# Patient Record
Sex: Female | Born: 1975 | Race: Black or African American | Hispanic: No | Marital: Single | Smoking: Never smoker
Health system: Southern US, Community
[De-identification: ages and names within clinical notes are randomized; demographics above are authoritative.]

## PROBLEM LIST (undated history)

## (undated) DIAGNOSIS — E119 Type 2 diabetes mellitus without complications: Secondary | ICD-10-CM

## (undated) DIAGNOSIS — F329 Major depressive disorder, single episode, unspecified: Secondary | ICD-10-CM

## (undated) DIAGNOSIS — F819 Developmental disorder of scholastic skills, unspecified: Secondary | ICD-10-CM

## (undated) DIAGNOSIS — F32A Depression, unspecified: Secondary | ICD-10-CM

## (undated) DIAGNOSIS — K219 Gastro-esophageal reflux disease without esophagitis: Secondary | ICD-10-CM

## (undated) DIAGNOSIS — G4733 Obstructive sleep apnea (adult) (pediatric): Secondary | ICD-10-CM

## (undated) DIAGNOSIS — E785 Hyperlipidemia, unspecified: Secondary | ICD-10-CM

## (undated) DIAGNOSIS — E669 Obesity, unspecified: Secondary | ICD-10-CM

## (undated) DIAGNOSIS — F419 Anxiety disorder, unspecified: Secondary | ICD-10-CM

## (undated) HISTORY — DX: Depression, unspecified: F32.A

## (undated) HISTORY — PX: ABDOMINAL HYSTERECTOMY: SHX81

## (undated) HISTORY — DX: Hyperlipidemia, unspecified: E78.5

## (undated) HISTORY — DX: Obesity, unspecified: E66.9

## (undated) HISTORY — DX: Type 2 diabetes mellitus without complications: E11.9

## (undated) HISTORY — DX: Gastro-esophageal reflux disease without esophagitis: K21.9

## (undated) HISTORY — DX: Major depressive disorder, single episode, unspecified: F32.9

## (undated) HISTORY — DX: Obstructive sleep apnea (adult) (pediatric): G47.33

## (undated) HISTORY — PX: CHOLECYSTECTOMY: SHX55

## (undated) HISTORY — DX: Anxiety disorder, unspecified: F41.9

---

## 1997-09-19 ENCOUNTER — Ambulatory Visit (HOSPITAL_COMMUNITY): Admission: RE | Admit: 1997-09-19 | Discharge: 1997-09-19 | Payer: Self-pay | Admitting: *Deleted

## 1999-05-03 ENCOUNTER — Emergency Department (HOSPITAL_COMMUNITY): Admission: EM | Admit: 1999-05-03 | Discharge: 1999-05-03 | Payer: Self-pay | Admitting: Emergency Medicine

## 2001-03-17 ENCOUNTER — Inpatient Hospital Stay (HOSPITAL_COMMUNITY): Admission: EM | Admit: 2001-03-17 | Discharge: 2001-03-29 | Payer: Self-pay | Admitting: Psychiatry

## 2001-05-12 ENCOUNTER — Encounter: Admission: RE | Admit: 2001-05-12 | Discharge: 2001-05-12 | Payer: Self-pay | Admitting: *Deleted

## 2001-10-20 ENCOUNTER — Encounter: Admission: RE | Admit: 2001-10-20 | Discharge: 2001-10-20 | Payer: Self-pay | Admitting: *Deleted

## 2002-02-06 ENCOUNTER — Encounter: Admission: RE | Admit: 2002-02-06 | Discharge: 2002-02-06 | Payer: Self-pay | Admitting: Internal Medicine

## 2002-02-06 ENCOUNTER — Encounter: Payer: Self-pay | Admitting: Internal Medicine

## 2002-09-21 ENCOUNTER — Encounter: Admission: RE | Admit: 2002-09-21 | Discharge: 2002-09-21 | Payer: Self-pay | Admitting: Internal Medicine

## 2002-09-21 ENCOUNTER — Encounter: Payer: Self-pay | Admitting: Internal Medicine

## 2002-09-22 ENCOUNTER — Emergency Department (HOSPITAL_COMMUNITY): Admission: EM | Admit: 2002-09-22 | Discharge: 2002-09-22 | Payer: Self-pay

## 2002-09-22 ENCOUNTER — Encounter: Payer: Self-pay | Admitting: Emergency Medicine

## 2002-10-31 ENCOUNTER — Inpatient Hospital Stay (HOSPITAL_COMMUNITY): Admission: RE | Admit: 2002-10-31 | Discharge: 2002-11-02 | Payer: Self-pay | Admitting: Obstetrics and Gynecology

## 2002-10-31 ENCOUNTER — Encounter (INDEPENDENT_AMBULATORY_CARE_PROVIDER_SITE_OTHER): Payer: Self-pay | Admitting: Specialist

## 2003-08-02 ENCOUNTER — Encounter: Admission: RE | Admit: 2003-08-02 | Discharge: 2003-08-02 | Payer: Self-pay | Admitting: Internal Medicine

## 2003-08-09 ENCOUNTER — Encounter: Admission: RE | Admit: 2003-08-09 | Discharge: 2003-08-09 | Payer: Self-pay | Admitting: Internal Medicine

## 2003-08-30 ENCOUNTER — Encounter (INDEPENDENT_AMBULATORY_CARE_PROVIDER_SITE_OTHER): Payer: Self-pay | Admitting: *Deleted

## 2003-08-30 ENCOUNTER — Observation Stay (HOSPITAL_COMMUNITY): Admission: RE | Admit: 2003-08-30 | Discharge: 2003-08-31 | Payer: Self-pay | Admitting: General Surgery

## 2003-11-14 ENCOUNTER — Ambulatory Visit (HOSPITAL_COMMUNITY): Admission: RE | Admit: 2003-11-14 | Discharge: 2003-11-14 | Payer: Self-pay | Admitting: Gastroenterology

## 2005-07-22 ENCOUNTER — Ambulatory Visit (HOSPITAL_COMMUNITY): Payer: Self-pay | Admitting: *Deleted

## 2005-10-07 ENCOUNTER — Ambulatory Visit (HOSPITAL_COMMUNITY): Payer: Self-pay | Admitting: *Deleted

## 2006-01-14 ENCOUNTER — Ambulatory Visit (HOSPITAL_COMMUNITY): Payer: Self-pay | Admitting: *Deleted

## 2006-01-21 ENCOUNTER — Ambulatory Visit (HOSPITAL_COMMUNITY): Admission: RE | Admit: 2006-01-21 | Discharge: 2006-01-21 | Payer: Self-pay | Admitting: Gastroenterology

## 2006-04-12 ENCOUNTER — Ambulatory Visit (HOSPITAL_COMMUNITY): Payer: Self-pay | Admitting: *Deleted

## 2006-07-12 ENCOUNTER — Ambulatory Visit (HOSPITAL_COMMUNITY): Payer: Self-pay | Admitting: *Deleted

## 2006-10-11 ENCOUNTER — Ambulatory Visit (HOSPITAL_COMMUNITY): Payer: Self-pay | Admitting: *Deleted

## 2007-01-10 ENCOUNTER — Ambulatory Visit (HOSPITAL_COMMUNITY): Payer: Self-pay | Admitting: *Deleted

## 2007-04-13 ENCOUNTER — Ambulatory Visit (HOSPITAL_COMMUNITY): Payer: Self-pay | Admitting: *Deleted

## 2007-07-20 ENCOUNTER — Ambulatory Visit (HOSPITAL_COMMUNITY): Payer: Self-pay | Admitting: *Deleted

## 2007-10-24 ENCOUNTER — Ambulatory Visit (HOSPITAL_COMMUNITY): Payer: Self-pay | Admitting: *Deleted

## 2008-01-16 ENCOUNTER — Ambulatory Visit (HOSPITAL_COMMUNITY): Payer: Self-pay | Admitting: *Deleted

## 2008-04-30 ENCOUNTER — Ambulatory Visit (HOSPITAL_COMMUNITY): Payer: Self-pay | Admitting: *Deleted

## 2008-08-06 ENCOUNTER — Ambulatory Visit (HOSPITAL_COMMUNITY): Payer: Self-pay | Admitting: *Deleted

## 2008-11-07 ENCOUNTER — Ambulatory Visit (HOSPITAL_COMMUNITY): Payer: Self-pay | Admitting: *Deleted

## 2009-01-29 ENCOUNTER — Ambulatory Visit (HOSPITAL_COMMUNITY): Payer: Self-pay | Admitting: Psychiatry

## 2009-04-11 ENCOUNTER — Ambulatory Visit (HOSPITAL_COMMUNITY): Payer: Self-pay | Admitting: Psychiatry

## 2009-05-19 ENCOUNTER — Ambulatory Visit (HOSPITAL_COMMUNITY): Payer: Self-pay | Admitting: Psychiatry

## 2009-07-14 ENCOUNTER — Ambulatory Visit (HOSPITAL_COMMUNITY): Payer: Self-pay | Admitting: Psychiatry

## 2009-10-01 ENCOUNTER — Ambulatory Visit (HOSPITAL_COMMUNITY): Payer: Self-pay | Admitting: Psychiatry

## 2009-11-26 ENCOUNTER — Ambulatory Visit (HOSPITAL_COMMUNITY): Payer: Self-pay | Admitting: Psychiatry

## 2010-01-19 ENCOUNTER — Ambulatory Visit (HOSPITAL_COMMUNITY): Payer: Self-pay | Admitting: Psychiatry

## 2010-03-18 ENCOUNTER — Ambulatory Visit (HOSPITAL_COMMUNITY)
Admission: RE | Admit: 2010-03-18 | Discharge: 2010-03-18 | Payer: Self-pay | Source: Home / Self Care | Attending: Psychiatry | Admitting: Psychiatry

## 2010-05-11 ENCOUNTER — Encounter (HOSPITAL_COMMUNITY): Payer: Medicaid Other | Admitting: Psychiatry

## 2010-05-11 DIAGNOSIS — F259 Schizoaffective disorder, unspecified: Secondary | ICD-10-CM

## 2010-07-17 NOTE — H&P (Signed)
NAME:  Erika [name], Erika [name]                       ACCOUNT NO.:  [id number]   MEDICAL RECORD NO.:  [id number]                   PATIENT TYPE:  INP   LOCATION:  NA                                   FACILITY:  [facility]   PHYSICIAN:  Katherine Roan, M.D.               DATE OF BIRTH:  Jun 01, 1975   DATE OF ADMISSION:  DATE OF DISCHARGE:                                HISTORY & PHYSICAL   CHIEF COMPLAINT:  Large abdominopelvic mass.   HISTORY OF PRESENT ILLNESS:  The patient is a 35 year old 69 pound female  who is on multiple medications, had not been seen for over a year, was seen  in the emergency room with abdominal pain and found to have a large 17 cm  fibroid.  Because of this large pelvic mass, the patient is scheduled for  laparotomy, possible hysterectomy.  Because of her age and mentally  challenged status, they both have agreed to the procedure.   MEDICATIONS:  1. Xanax.  2. Zyprexa.  3. Zoloft.  4. Ambien.   ALLERGIES:  HALDOL.   REVIEW OF SYSTEMS:  HEENT:  She denies any decrease in visual or auditory  acuity.  No headache or no hypertension, no chest pain, no history of mitral  valve prolapse.  LUNGS:  No chronic cough, no asthma.  GENITOURINARY:  No  stress incontinence or frequent UTI's.  GASTROINTESTINAL:  Negative.  MUSCLES, BONES, AND JOINTS:  Negative.  No fractures or arthritis.   SOCIAL HISTORY:  She works at [employer], does not smoke.   FAMILY HISTORY:  Her mother and father are both living and well.  She has  one brother.   PHYSICAL EXAMINATION:  GENERAL:  A well-developed, 35 year old obese black  female.  She is a gravida 1, para 0.  HEENT:  Unremarkable.  The oropharynx is not injected.  NECK:  Supple.  Carotid pulses are equal without bruits.  Thyroid is not  enlarged.  BREASTS:  No masses or tenderness.  She has multiple small pigmented lesions  over the anterior thorax and breasts.  LUNGS:  Clear.  HEART:  Normal sinus rhythm, no murmurs.  ABDOMEN:   Markedly obese.  The uterus is well above the umbilicus.  No other  masses are noted.  She is quite sensitive.  Bowel sounds are normal, and no  bruits are heard.  EXTREMITIES:  Trace edema, normal reflexes.  PELVIC:  A fairly small cervix and uterus is enlarged with multiple myomas.  EXTREMITIES:  Good range of motion, equal pulses and reflexes.   IMPRESSION:  Large abdominopelvic mass felt to be a large uterine fibroid on  multiple studies.   PLAN:  Laparotomy and hysterectomy as needed.  Risks and benefits have been  discussed with the patient and with her mother.  Katherine Roan, M.D.    SDM/MEDQ  D:  10/29/2002  T:  10/29/2002  Job:  [id number]

## 2010-07-17 NOTE — Discharge Summary (Signed)
   Erika [name], WILMOUTH                       ACCOUNT NO.:  [id number]   MEDICAL RECORD NO.:  [id number]                   PATIENT TYPE:  INP   LOCATION:  0448                                 FACILITY:  [facility]   PHYSICIAN:  Katherine Roan, M.D.               DATE OF BIRTH:  04/15/1975   DATE OF ADMISSION:  10/31/2002  DATE OF DISCHARGE:  11/02/2002                                 DISCHARGE SUMMARY   ADMISSION DIAGNOSES:  Large pelvic mass.   DISCHARGE DIAGNOSES:  Large uterine fibroid.   BRIEF HISTORY:  This is a 35 year old female on multiple medications  including Zyprexa and Zoloft.  Was seen in the ER with abdominal pain and  found to have a large 17 cm fibroid.  The patient has mentally challenged  issues and discussion with mother and patient about hysterectomy was  accomplished pre admission.   LABORATORIES:  Admission hemoglobin 12.  Routine chemistries were normal.  Chest x-ray was essentially normal.  Her other comorbidities were morbid  obesity.   HOSPITAL COURSE:  On September 1 she underwent a laparotomy with total  hysterectomy with the finding of a large uterine fibroid.  The uterus  pathologically was benign but the uterus weighed 1529 g.  There were some  mild atypical changes in one of the fibroids.  Her postoperative course was  uncomplicated.  She remained afebrile and without complaints and was  discharged to home and office care.  She was asked to return to the office  in approximately three days for incision check.   CONDITION ON DISCHARGE:  Improved.                                               Katherine Roan, M.D.    SDM/MEDQ  D:  11/22/2002  T:  11/22/2002  Job:  [id number]

## 2010-07-17 NOTE — Discharge Summary (Signed)
[facility]  Patient:    Erika [name], Erika [name] Visit Number: [id number] MRN: [id number]          Service Type: PSY Location: 400 0402 02 Attending Physician:  Jeanice Lim Dictated by:   Jeanice Lim, M.D. Admit Date:  03/17/2001 Discharge Date: 03/29/2001                             Discharge Summary  IDENTIFYING DATA:  This was a 35 year old African-American female, voluntarily admitted, referred by Jasmine Pang, M.D., secondary to agitation, auditory hallucinations, weird dreams, repetitive behaviors, and believing that she is pregnant.  MEDICATIONS: 1. Zoloft 25 mg b.i.d. 2. Hydrochlorothiazide 25 mg three times a week.  DRUG ALLERGIES:  PROZAC, HALDOL, and THORAZINE.  THORAZINE caused NMS.  PHYSICAL EXAMINATION:  Unremarkable.  Neurologically nonfocal.  Mental Status: An obese African-American female.  Agitated, turning away, and fearful. Speech pressured.  Looking at the ceiling and making poor eye contact. Talking about the devil.  Mood:  Agitated and fearful.  Thought Processes: Disorganized.  Thought Content:  Positive for auditory hallucinations and delusions.  Cognitively intact.  LABORATORY DATA:  Routine admission labs essentially within normal limits.  ADMISSION DIAGNOSES: Axis I.    Psychosis not otherwise specified. Axis II.   Mild mitral regurgitation. Axis III.  1. Rule out pregnancy.            2. Urinary tract infection.            3. History of neuroleptic malignant syndrome.            4. Hypokalemia. Axis IV.   Psychosocial stressors:  Moderate, problems with primary support. Axis V.    Global assessment of functioning:  28/56.  HOSPITAL COURSE:  The patient was admitted.  Ordered routine p.r.n. medications.  Monitored medically.  Restarted on hydrochlorothiazide and K-Dur to replace potassium.  Requiring one to one due to disorganized behavior. Seroquel was titrated along with Ativan and conventional  antipsychotics were avoided to avoid risk of NMS.  Due to low-grade temperature, Seroquel was held and CPK was checked, which was within normal limits.  Zyprexa was then restarted for psychotic symptoms.  Blood cultures were obtained to rule out sepsis.  Ativan and Zyprexa were titrated.  As well, Zoloft was started for a depressive component.  The patient tolerated medication changes well and clinical intervention, showing improvement in symptoms.  Her condition at discharge was improved.  Mood was more stable and euthymic.  Affect brighter. Thought processes goal directed.  Thought content negative for overt psychotic symptoms.  The patient denied dangerous ideation and reported motivation to be compliant with a follow-up plan showing improvement in judgment and insight.  DISCHARGE MEDICATIONS: 1. Ativan 0.5 mg one-half q.a.m., one-half at 3 p.m., and one q.h.s. 2. Ambien 10 mg q.h.s. p.r.n. insomnia. 3. Zyprexa 2.5 mg q.a.m., at 3 p.m., and 15 mg q.h.s. 4. Zoloft 100 mg one-half q.a.m.  FOLLOW-UP:  The patient was to follow up with Jasmine Pang, M.D., on March 31, 2001, at 2 p.m.  DISCHARGE DIAGNOSES: Axis I.    Psychosis not otherwise specified. Axis II.   Mild mitral regurgitation. Axis III.  1. Rule out pregnancy.            2. Urinary tract infection.            3. History of ______  4. Hypokalemia. Axis IV.   Psychosocial stressors:  Moderate, problems with primary support. Axis V.    Global assessment of functioning:  50. Dictated by:   Jeanice Lim, M.D. Attending Physician:  Jeanice Lim DD:  05/11/01 TD:  05/13/01 Job: [id number]

## 2010-07-17 NOTE — Op Note (Signed)
Erika [name], Erika [name]                       ACCOUNT NO.:  [id number]   MEDICAL RECORD NO.:  [id number]                   PATIENT TYPE:  OBV   LOCATION:  0367                                 FACILITY:  [facility]   PHYSICIAN:  Ollen Gross. Vernell Morgans, M.D.              DATE OF BIRTH:  08-30-1975   DATE OF PROCEDURE:  08/30/2003  DATE OF DISCHARGE:  08/31/2003                                 OPERATIVE REPORT   PREOPERATIVE DIAGNOSIS:  Gallstones.   POSTOPERATIVE DIAGNOSIS:  Gallstones.   OPERATION/PROCEDURE:  Laparoscopic cholecystectomy with intraoperative  cholangiogram.   SURGEON:  Ollen Gross. Carolynne Edouard, M.D.   ASSISTANT:  Leonie Man, M.D.   ANESTHESIA:  General endotracheal anesthesia.   DESCRIPTION OF PROCEDURE:  After informed consent was obtained, the patient  was brought to the operating room and placed in the supine position on the  operating room table.  After adequate induction of general anesthesia, the  patient's abdomen was prepped with Betadine and draped in the usual sterile  manner.  The area below the umbilicus was infiltrated with 0.25% Marcaine.  A small incision was made with the 15-blade knife.  This incision was  carried down through the subcutaneous tissue bluntly with the Kelly clamp  and Army-Navy retractors until the linea alba was identified.  The linea  alba was incised with the 15-blade knife and each side was grasped with  Kocher clamps and elevated anteriorly.  The preperitoneal space was then  probed bluntly with a hemostat until the peritoneum was opened and access  was gained to the abdominal cavity.  A 0 Vicryl pursestring stitch was  placed in the fascia surrounding the opening. The Hasson cannula was placed  through the opening and anchored in place with the previously placed Vicryl  pursestring stitch.  The abdomen was then insufflated with carbon dioxide  without difficulty.  The patient was placed in the head-up position and  rotated slightly with the  right side up.  The laparoscopic was placed  through the Hasson cannula and the right upper quadrant was inspected.  The  dome of the gallbladder and liver were readily identified.  The epigastric  region was then infiltrated with 0.25% Marcaine.  A small incision was made  with the 15-blade knife and a 10 mm port was placed bluntly through this  incision into the abdominal cavity under direct vision.  Sites were then  chosen laterally on the right side of the abdomen with placement of a 5 mm  ports.  Each of these areas was infiltrated with 0.25% Marcaine.  Small stab  incisions were made with 15-blade knife and 5 mm ports were placed bluntly  through these incisions into the abdominal cavity under direct vision.  A  blunt grasper was placed through the lateral-most 5 mm port and used to  grasp the dome of the gallbladder and elevate it anteriorly and superiorly.  Another blunt grasper was  placed in the other 5 mm port and used to retract  on the body and neck of the gallbladder.  The dissector was placed through  the epigastric port and using the electrocautery, the peritoneal reflection  at the gallbladder neck area was opened.  Blunt dissection was then carried  out in this area until the gallbladder neck-cystic duct junction was readily  identified and a good window was created.  A single clip was placed  proximally on the gallbladder neck.  A small ductotomy was made just below  this with the with the laparoscopic scissors.  A 14-gauge angiocath was  placed percutaneously through the anterior abdominal wall under direct  vision.  A Reddick cholangiogram catheter was placed through the angiocath  and flushed.  The catheter was then placed within the cystic duct and  anchored in place with a clip.  A cholangiogram was obtained that showed no  filling defects, a good length on the cystic duct and rapid emptying into  the duodenum.  The anchoring clip and catheters were then removed from  the  patient.  Three  clips were placed proximally on the cystic duct and the  duct was divided between the two sets of clips.  Posterior to this the  cystic artery was identified and again dissected bluntly in a  circumferential manner until a good window was created.  Two clips were  placed proximally and one distally on the artery and the artery was divided  between the two.  Next, the laparoscopic hook cautery device was used to  separate the gallbladder from the liver bed.  Prior to completely detaching  the gallbladder from the liver bed, the liver bed was inspected and several  small bleeding points were coagulated with the electrocautery.  The  gallbladder was then detached the rest of the way from the liver bed and  using the hook electrocautery without difficulty, laparoscopic bag was  placed through the epigastric port and the gallbladder we placed within the  bag and the bag was sealed.  The laparoscopic was then moved to the  epigastric port.  The abdomen was then irrigated with copious amounts of  saline until the effluent was clear.  The liver bed was inspected again and  found to be hemostatic. A gallbladder grasper was placed through the Hasson  cannula and used to grasp the opening of the bag.  The bag with the  gallbladder was then removed through the infraumbilical port without  difficulty.  The fascial defect was closed with the 0 Vicryl pursestring  stitch as well as with another interrupted 0 Vicryl stitch.  The rest of the  ports were removed under direct vision and found to be hemostatic.  The gas  was allowed to escape.  The skin incisions were all closed with interrupted  4-0 Monocryl subcuticular stitches.  Benzoin and Steri-Strips were applied.  The patient tolerated the procedure well.  At the end of the case all  needle, sponge and instrument counts were correct.  The patient was awakened and taken to the recovery room in stable condition.                                                Ollen Gross. Vernell Morgans, M.D.    PST/MEDQ  D:  09/09/2003  T:  09/09/2003  Job:  [id number]

## 2010-07-17 NOTE — Op Note (Signed)
NAME:  Erika [name], Erika [name]                       ACCOUNT NO.:  [id number]   MEDICAL RECORD NO.:  [id number]                   PATIENT TYPE:  AMB   LOCATION:  ENDO                                 FACILITY:  [facility]   PHYSICIAN:  James L. Malon Kindle., M.D.          DATE OF BIRTH:  1975/06/30   DATE OF PROCEDURE:  11/14/2003  DATE OF DISCHARGE:                                 OPERATIVE REPORT   PROCEDURE:  Endoscopy.   MEDICATIONS:  1.  Fentanyl 100 mcg.  2.  Versed 10 mg IV.  3.  Cetacaine spray.   INDICATION:  Vague abdominal pain, heme-positive stool with a history of  melena.   DESCRIPTION OF PROCEDURE:  The procedure had been explained to the patient  and consent obtained.  With the patient in the left lateral decubitus  position, the Olympus scope was inserted and advanced.  The stomach was  entered, pylorus identified and passed.  The duodenum including the bulb and  second portion were seen well and were unremarkable.  The scope was  withdrawn.  The pyloric channel, duodenal bulb were normal.  The antrum and  body were seen well with no ulceration or inflammation.  The fundus and  cardia were seen well on retroflexed view and were normal.  The GE junction  was widely patent.  The distal esophagus was endoscopically normal with no  ulcerations.  The proximal esophagus was normal.  The scope was withdrawn.  The patient tolerated the procedure well.   ASSESSMENT:  1.  Probable gastroesophageal reflux disease.  530.81.  2.  Heme-positive of questionable cause.  792.1.  No obvious cause on upper      endoscopy.   PLAN:  We will see the patient back in the office in 2 weeks.  We will check  the labs already drawn in our office.                                               James L. Malon Kindle., M.D.    Erika [name]  D:  11/14/2003  T:  11/14/2003  Job:  [id number]   cc:   Ralene Ok, M.D.  [address]  [place]  [place] [address]  Fax: [phone]

## 2010-07-17 NOTE — Op Note (Signed)
   NAME:  Erika [name], Erika [name]                       ACCOUNT NO.:  [id number]   MEDICAL RECORD NO.:  [id number]                   PATIENT TYPE:  INP   LOCATION:  0002                                 [facility]ACILITY:  [facility]   PHYSICIAN:  Katherine Roan, M.D.               DATE OF BIRTH:  03/12/1975   DATE OF PROCEDURE:  10/31/2002  DATE OF DISCHARGE:                                 OPERATIVE REPORT   PREOPERATIVE DIAGNOSES:  1. Large uterine fibroid.  2. Pelvic pain.   POSTOPERATIVE DIAGNOSES:  1. Large uterine fibroid.  2. Pelvic pain.   OPERATION:  1. Total abdominal hysterectomy.  2. Pelvic exam under anesthesia.   SURGEON:  Katherine Roan, M.D.   ASSISTANT:  Juluis Mire, M.D.   DESCRIPTION OF PROCEDURE:  Satisfactory general anesthesia in the supine  position.  The patient was anesthetized and exam under anesthesia confirmed  a large pelvic mass which extended above the umbilicus to right at the  xiphoid.  She was prepped and draped over the entire abdomen.  Foley  catheter was inserted.  A midline incision was made in the abdomen and  carried around the umbilicus.  Hemostasis with the Bovie, and the abdomen  was entered.  There was slight free fluid within the abdomen.  The uterus  was markedly elevated, was about 24 cm in length, was delivered into the  operative wound and the uteroovarian anastomoses, tubal structures, and  round ligaments were suture ligated.  Uterine vessels were secured, and a  fundectomy was performed.  Then the cervix was removed using the parametrial  clamps.  The vagina was clamped across and the cervix removed.  The vagina  was closed with one suture of 0 chromic suture.  Hemostasis was secure.  Both ovaries, pedicles, and all appeared to be hemostatically secure.  The  peritoneum was then closed with 2-0 PDS.  The fascia was closed with a  continuous #1 PDS.  I closed that in two layers and reinforced around the  umbilicus with pop-off 0 Novofil x  3 sutures.  The subcutaneum was closed  with 3-0 plain, and the skin was closed with 3-0 nylon and clips.  The  incision was infiltrated with 0.5% Marcaine with epinephrine.  [name]  tolerated this procedure well and was sent to recovery room in good  condition.                                               Katherine Roan, M.D.    SDM/MEDQ  D:  10/31/2002  T:  10/31/2002  Job:  [id number]

## 2010-08-10 ENCOUNTER — Encounter (HOSPITAL_COMMUNITY): Payer: Medicaid Other | Admitting: Psychiatry

## 2010-08-10 DIAGNOSIS — F259 Schizoaffective disorder, unspecified: Secondary | ICD-10-CM

## 2010-10-12 ENCOUNTER — Encounter (HOSPITAL_COMMUNITY): Payer: Medicaid Other | Admitting: Psychiatry

## 2010-10-14 ENCOUNTER — Encounter (HOSPITAL_COMMUNITY): Payer: Medicaid Other | Admitting: Psychiatry

## 2010-10-14 DIAGNOSIS — F259 Schizoaffective disorder, unspecified: Secondary | ICD-10-CM

## 2010-12-09 ENCOUNTER — Encounter (HOSPITAL_COMMUNITY): Payer: Medicaid Other | Admitting: Psychiatry

## 2010-12-14 ENCOUNTER — Encounter (HOSPITAL_COMMUNITY): Payer: Medicaid Other | Admitting: Psychiatry

## 2010-12-23 ENCOUNTER — Encounter (HOSPITAL_COMMUNITY): Payer: Medicaid Other | Admitting: Psychiatry

## 2010-12-23 DIAGNOSIS — F259 Schizoaffective disorder, unspecified: Secondary | ICD-10-CM

## 2011-02-03 ENCOUNTER — Encounter (HOSPITAL_COMMUNITY): Payer: Self-pay | Admitting: Psychiatry

## 2011-02-03 ENCOUNTER — Ambulatory Visit (INDEPENDENT_AMBULATORY_CARE_PROVIDER_SITE_OTHER): Payer: Medicaid Other | Admitting: Psychiatry

## 2011-02-03 VITALS — Wt 277.0 lb

## 2011-02-03 DIAGNOSIS — F209 Schizophrenia, unspecified: Secondary | ICD-10-CM

## 2011-02-03 NOTE — Progress Notes (Signed)
Patient came for her followup appointment with her mother. She has been stable on her current medication. She reported no side effects of medication. Last week she has an incident when patient lost her dog who she has been for past 4 years. As per mother patient become very sad, upset and "shutdown" she was given Ativan 0.5mg   tablet which helped her. Overall patient is doing good. She is sleeping better. She is going to have work regularly. Her complaint was profile on TV but  She did not able to see that program. She denies any anger crying spells or anhedonia. As per mother she is more involved in her daily life and routine. She reported tremors shakes or extrapyramidal side effects. She is taking Ativan only rarely.  Mental status examination Patient is mildly obese female who is casually dressed. She maintained fair eye contact. Her speech is slow and nonspontaneous. She denies any active or passive suicidal thinking and homicidal thinking. She described her mood is okay and her affect is flat. She denies any auditory or visual hallucination. She's alert and oriented x3. Her thought process is slow but logical linear and goal-directed. There are no tremors or shakes present. Her insight judgment and impulse control is okay.  Assessment Schizophrenia chronic paranoid type.  Plan I will continue her current medication which is Abilify 30 mg daily, Zoloft 150 mg daily temazepam 15 mg at bedtime and lorazepam 0.5 mg for severe anxiety. I have explained risks and benefits of medication in detail. Patient is scheduled to see her primary care physician on January 17 where she will get her blood work. I recommended to bring all the blood work on her next visit. I will see her again in 2 months. I recommended to call if she has any question or concern about the medication or if she feels worsening of her symptoms

## 2011-02-03 NOTE — Patient Instructions (Signed)
Please bring blood results on her next visit

## 2011-03-31 ENCOUNTER — Encounter (HOSPITAL_COMMUNITY): Payer: Self-pay | Admitting: Psychiatry

## 2011-03-31 ENCOUNTER — Ambulatory Visit (INDEPENDENT_AMBULATORY_CARE_PROVIDER_SITE_OTHER): Payer: Medicaid Other | Admitting: Psychiatry

## 2011-03-31 VITALS — BP 92/53 | HR 78 | Wt 274.0 lb

## 2011-03-31 DIAGNOSIS — F200 Paranoid schizophrenia: Secondary | ICD-10-CM

## 2011-03-31 MED ORDER — SERTRALINE HCL 100 MG PO TABS: 150.0000 mg | ORAL_TABLET | Freq: Every day | ORAL | Status: DC

## 2011-03-31 MED ORDER — ARIPIPRAZOLE 30 MG PO TABS: 30.0000 mg | ORAL_TABLET | Freq: Every day | ORAL | Status: DC

## 2011-03-31 MED ORDER — TEMAZEPAM 15 MG PO CAPS: 15.0000 mg | ORAL_CAPSULE | Freq: Every day | ORAL | Status: DC

## 2011-03-31 NOTE — Progress Notes (Signed)
Chief Complaint I'm doing better and need my medication  History of presenting illness Patient is 36 year old [place] American female who came for her followup appointment. She was last seen in December. At that time she was having some depression as her dog died she had for past 4 years. Patient overall doing okay on her medication. She does not require any Ativan in past 6 weeks. She is sleeping better. She is now working full-time as a Agricultural consultant. She likes her job. She denies any side effects or concern about the medication. As per mother patient has significantly improved from the past. She is more social verbal and able to do a lot of things in her life. We have cut down slowly and gradually some of the medication which she was on 2 years ago. Patient denies any paranoid thinking agitation anger or mood swings.  Past psychiatric history Patient has significant history of psychiatric illness started in her teens. Patient has at least 3 psychiatric hospitalization at [facility]. Most of psychiatric admissiondictated by paranoid thinking. She denies any history of suicidal attempt.  Family history Patient has a family history of psychiatric ounce.  Psychosocial history Patient was born and raised in [place]. She also spent some life in [place] and currently living with her mother increased to the past many years. Patient has good social network and extended family in this area.  Alcohol and substance use history Patient denies any history of alcohol or substance use.  Medical history Patient has history of GERD and high cholesterol. She has recently seen her primary care physician and blood work was drawn and results were within normal limits.  Current medication Reviewed  Mental status examination Patient is morbid obese female who is casually dressed and fairly groomed. She maintained poor eye contact. Her speech is nonspontaneous however she is relevant in  conversation. She has poverty of thought content but denies any paranoid thinking or delusional thoughts. She denies any active or passive suicidal thoughts or homicidal thoughts. He denies any auditory or visual hallucination. Her thought process is slow logical linear and goal-directed. She's alert and oriented x3. Her response to questions is slow. Her insight judgment and impulse control is okay.  Assessment Axis I schizophrenia chronic paranoid type, rule out MDD Axis II deferred Axis III see medical history Axis IV mild to moderate Axis V 55-60  Plan At this time patient is in stable on her current medication. I have reviewed the blood test which was drawn on the 17th of this month which shows normal cholesterol and triglycerides. Her glucose is 115 and her liver enzymes are within normal limits. I will continue her current medication. She does not need Ativan since she is not taking any more. I have explained risks and benefits of medication in detail. I will see her again in 3 months. Time spent 30 minutes

## 2011-05-24 ENCOUNTER — Ambulatory Visit (INDEPENDENT_AMBULATORY_CARE_PROVIDER_SITE_OTHER): Payer: Medicaid Other | Admitting: Psychiatry

## 2011-05-24 ENCOUNTER — Ambulatory Visit (HOSPITAL_COMMUNITY): Payer: Medicaid Other | Admitting: Psychiatry

## 2011-05-24 ENCOUNTER — Encounter (HOSPITAL_COMMUNITY): Payer: Self-pay | Admitting: Psychiatry

## 2011-05-24 VITALS — Wt 276.0 lb

## 2011-05-24 DIAGNOSIS — F29 Unspecified psychosis not due to a substance or known physiological condition: Secondary | ICD-10-CM

## 2011-05-24 DIAGNOSIS — F200 Paranoid schizophrenia: Secondary | ICD-10-CM

## 2011-05-24 MED ORDER — TEMAZEPAM 15 MG PO CAPS: 15.0000 mg | ORAL_CAPSULE | Freq: Every day | ORAL | Status: DC

## 2011-05-24 MED ORDER — ARIPIPRAZOLE 30 MG PO TABS: 30.0000 mg | ORAL_TABLET | Freq: Every day | ORAL | Status: DC

## 2011-05-24 MED ORDER — SERTRALINE HCL 100 MG PO TABS: 150.0000 mg | ORAL_TABLET | Freq: Every day | ORAL | Status: DC

## 2011-05-24 NOTE — Progress Notes (Signed)
Chief Complaint  medication management and followup   History of presenting illness Patient is 36 year old [place] American female who came for her followup appointment.  She's been compliant with her medication and reported no side effects.  Her mother endorse much improvement in her mood swing anger psychosis and paranoia.  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  She is more active alert and involved in her daily life.  She reported no side effects of medication.  She likes her volunteer work.  She has no concern that medication.  Patient denies any agitation anger or any paranoid thinking.  She is scheduled to see her primary care physician in June.  Past psychiatric history Patient has significant history of psychiatric illness started in her teens. Patient has at least 3 psychiatric hospitalization at [facility]. Most of psychiatric admissiondictated by paranoid thinking. She denies any history of suicidal attempt.  Family history Patient has a family history of psychiatric ounce.  Psychosocial history Patient was born and raised in [place]. She also spent some life in [place] and currently living with her mother increased to the past many years. Patient has good social network and extended family in this area.  Alcohol and substance use history Patient denies any history of alcohol or substance use.  Medical history Patient has history of GERD and high cholesterol. She has recently seen her primary care physician and blood work was drawn and results were within normal limits.  Current medication Reviewed  Mental status examination Patient is morbid obese female who is casually dressed and fairly groomed. She maintained fair eye contact.  She is more interactive in conversation.  Her speech is slow but clear and coherent.  Her thought processes are logical linear and goal-directed.  She denies any auditory or visual hallucination.  She  still has poverty of thought content but she denies any paranoid thinking or delusions.  She denies any active or passive suicidal thinking and homicidal thinking.  Her attention and concentration is fair.  She's alert and oriented x3.  Her insight judgment and impulse control is okay.  Assessment Axis I schizophrenia chronic paranoid type, rule out MDD Axis II deferred Axis III see medical history Axis IV mild to moderate Axis V 55-60  Plan I will continue her current medication .  I've explained risks and benefits of medication in detail.  I recommended to call us if she feels worsening of her symptoms or any time having suicidal thinking and homicidal thinking .  I will see her again in 8 weeks.

## 2011-07-22 ENCOUNTER — Other Ambulatory Visit (HOSPITAL_COMMUNITY): Payer: Self-pay | Admitting: Psychiatry

## 2011-07-22 DIAGNOSIS — F29 Unspecified psychosis not due to a substance or known physiological condition: Secondary | ICD-10-CM

## 2011-07-23 ENCOUNTER — Other Ambulatory Visit (HOSPITAL_COMMUNITY): Payer: Self-pay | Admitting: Psychology

## 2011-07-23 ENCOUNTER — Other Ambulatory Visit (HOSPITAL_COMMUNITY): Payer: Self-pay

## 2011-07-27 ENCOUNTER — Other Ambulatory Visit (HOSPITAL_COMMUNITY): Payer: Self-pay | Admitting: *Deleted

## 2011-08-02 ENCOUNTER — Encounter (HOSPITAL_COMMUNITY): Payer: Self-pay | Admitting: Psychiatry

## 2011-08-02 ENCOUNTER — Ambulatory Visit (INDEPENDENT_AMBULATORY_CARE_PROVIDER_SITE_OTHER): Payer: Medicaid Other | Admitting: Psychiatry

## 2011-08-02 DIAGNOSIS — F29 Unspecified psychosis not due to a substance or known physiological condition: Secondary | ICD-10-CM

## 2011-08-02 DIAGNOSIS — F200 Paranoid schizophrenia: Secondary | ICD-10-CM

## 2011-08-02 MED ORDER — ARIPIPRAZOLE 30 MG PO TABS: 30.0000 mg | ORAL_TABLET | Freq: Every day | ORAL | Status: DC

## 2011-08-02 MED ORDER — TEMAZEPAM 15 MG PO CAPS: 15.0000 mg | ORAL_CAPSULE | Freq: Every day | ORAL | Status: DC

## 2011-08-02 MED ORDER — SERTRALINE HCL 100 MG PO TABS: 100.0000 mg | ORAL_TABLET | Freq: Every day | ORAL | Status: DC

## 2011-08-02 NOTE — Progress Notes (Signed)
Chief Complaint  medication management and followup   History of presenting illness Patient is 36 year old [place] female who came for her followup appointment.  She's been compliant with her medication and reported no side effects.  Patient denies any agitation anger mood swing.  She is more active in her volunteer work .  Patient goes 5 days a week.  She denies any side effects of medication.  Her mother seems to be satisfied with the patient improvement and level of functioning.  Patient denies any paranoia or any hallucination.  She does not feel very anxious and discomfort among people.  Patient is scheduled to see her primary care physician Dr. Cindee Lame in July for complete blood work and and we'll checkup.  Current psychiatric medication Abilify 30 mg daily Temazepam 50 mg at bedtime Zoloft 100 mg one and a half tablet daily.  Past psychiatric history Patient has significant history of psychiatric illness started in her teens. Patient has at least 3 psychiatric hospitalization at [facility]. Most of psychiatric admissiondictated by paranoid thinking. She denies any history of suicidal attempt.  Family history Patient has a family history of psychiatric ounce.  Psychosocial history Patient was born and raised in [place]. She also spent some life in [place] and currently living with her mother increased to the past many years. Patient has good social network and extended family in this area.  Alcohol and substance use history Patient denies any history of alcohol or substance use.  Medical history Patient has history of GERD and high cholesterol. She has recently seen her primary care physician and blood work was drawn and results were within normal limits.  Current medication Reviewed  Mental status examination Patient is morbid obese female who is casually dressed and fairly groomed. She maintained fair eye contact.  She is cooperative and  relevant in conversation. Her speech is slow but clear and coherent.  Her thought process is slow but logical linear and goal-directed.  She denies any auditory or visual hallucination.  She still has poverty of thought content but she denies any paranoid thinking or delusions.  She denies any active or passive suicidal thinking and homicidal thinking.  Her attention and concentration is fair.  She's alert and oriented x3.  Her insight judgment and impulse control is okay.  Assessment Axis I schizophrenia chronic paranoid type, rule out MDD Axis II deferred Axis III see medical history Axis IV mild to moderate Axis V 55-60  Plan I will continue her current medication .  I've explained risks and benefits of medication in detail.  I recommended to call us if she feels worsening of her symptoms or any time having suicidal thinking and homicidal thinking .  I will see her again in 3 months.

## 2011-11-03 ENCOUNTER — Ambulatory Visit (INDEPENDENT_AMBULATORY_CARE_PROVIDER_SITE_OTHER): Payer: Medicaid Other | Admitting: Psychiatry

## 2011-11-03 ENCOUNTER — Encounter (HOSPITAL_COMMUNITY): Payer: Self-pay | Admitting: Psychiatry

## 2011-11-03 DIAGNOSIS — F200 Paranoid schizophrenia: Secondary | ICD-10-CM

## 2011-11-03 DIAGNOSIS — F29 Unspecified psychosis not due to a substance or known physiological condition: Secondary | ICD-10-CM

## 2011-11-03 MED ORDER — ARIPIPRAZOLE 30 MG PO TABS: 30.0000 mg | ORAL_TABLET | Freq: Every day | ORAL | Status: DC

## 2011-11-03 MED ORDER — SERTRALINE HCL 100 MG PO TABS: 100.0000 mg | ORAL_TABLET | Freq: Every day | ORAL | Status: DC

## 2011-11-03 MED ORDER — TEMAZEPAM 15 MG PO CAPS: 15.0000 mg | ORAL_CAPSULE | Freq: Every day | ORAL | Status: DC

## 2011-11-03 NOTE — Progress Notes (Signed)
Chief Complaint Medication management and followup  History of presenting illness Patient is 36 year old [place] American female who came with her mother for her followup appointment.  She's been compliant with her medication and reported no side effects.  She visited [place] to see her family members and have a good time.  She denies any agitation anger mood swing.  She's sleeping better.  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  Patient was seen by her primary care physician Dr. Cindee Lame in July and as per patient's mother blood results are normal.  She denies any side effects of medication.  She is not drinking or using any illegal substance.  Current psychiatric medication Abilify 30 mg daily Temazepam 15 mg at bedtime Zoloft 100 mg one and a half tablet daily.  Past psychiatric history Patient has significant history of psychiatric illness started in her teens. Patient has at least 3 psychiatric hospitalization at [facility]. Most of psychiatric admissiondictated by paranoid thinking. She denies any history of suicidal attempt.  Family history Patient denies family history of psychiatric illness.   Psychosocial history Patient was born and raised in [place]. She also spent some life in [place] and currently living with her mother increased to the past many years. Patient has good social network and extended family in this area.  Alcohol and substance use history Patient denies any history of alcohol or substance use.  Medical history Patient has history of GERD and high cholesterol. She has recently seen her primary care physician and blood work was drawn and results were within normal limits.  Current medication Reviewed  Mental status examination Patient is morbid obese female who is casually dressed and fairly groomed. She maintained fair eye contact.  She is cooperative and relevant in conversation. Her speech is slow but clear and coherent.   Her thought process is slow but logical linear and goal-directed.  She denies any auditory or visual hallucination.  She still has poverty of thought content but she denies any paranoid thinking or delusions.  She denies any active or passive suicidal thinking and homicidal thinking.  Her attention and concentration is fair.  She's alert and oriented x3.  Her insight judgment and impulse control is okay.  Assessment Axis I schizophrenia chronic paranoid type, rule out MDD Axis II deferred Axis III see medical history Axis IV mild to moderate Axis V 55-60  Plan I will continue her current medication .  I've explained risks and benefits of medication in detail.  I recommended to call us if she feels worsening of her symptoms or any time having suicidal thinking and homicidal thinking .  I recommend to have her blood results faxed to [place]. I will see her again in 3 months.

## 2012-01-14 ENCOUNTER — Other Ambulatory Visit (HOSPITAL_COMMUNITY): Payer: Self-pay | Admitting: Psychiatry

## 2012-01-14 DIAGNOSIS — F29 Unspecified psychosis not due to a substance or known physiological condition: Secondary | ICD-10-CM

## 2012-01-17 ENCOUNTER — Other Ambulatory Visit (HOSPITAL_COMMUNITY): Payer: Self-pay | Admitting: Psychiatry

## 2012-01-17 DIAGNOSIS — F29 Unspecified psychosis not due to a substance or known physiological condition: Secondary | ICD-10-CM

## 2012-02-02 ENCOUNTER — Encounter (HOSPITAL_COMMUNITY): Payer: Self-pay | Admitting: Psychiatry

## 2012-02-02 ENCOUNTER — Ambulatory Visit (INDEPENDENT_AMBULATORY_CARE_PROVIDER_SITE_OTHER): Payer: Medicaid Other | Admitting: Psychiatry

## 2012-02-02 VITALS — BP 115/53 | HR 81 | Wt 274.6 lb

## 2012-02-02 DIAGNOSIS — K219 Gastro-esophageal reflux disease without esophagitis: Secondary | ICD-10-CM

## 2012-02-02 DIAGNOSIS — F200 Paranoid schizophrenia: Secondary | ICD-10-CM

## 2012-02-02 DIAGNOSIS — F29 Unspecified psychosis not due to a substance or known physiological condition: Secondary | ICD-10-CM

## 2012-02-02 DIAGNOSIS — E119 Type 2 diabetes mellitus without complications: Secondary | ICD-10-CM | POA: Insufficient documentation

## 2012-02-02 MED ORDER — TEMAZEPAM 15 MG PO CAPS: 15.0000 mg | ORAL_CAPSULE | Freq: Every day | ORAL | Status: DC

## 2012-02-02 MED ORDER — ARIPIPRAZOLE 20 MG PO TABS: 20.0000 mg | ORAL_TABLET | Freq: Every day | ORAL | Status: DC

## 2012-02-02 MED ORDER — SERTRALINE HCL 100 MG PO TABS: ORAL_TABLET | ORAL | Status: DC

## 2012-02-02 NOTE — Progress Notes (Signed)
Patient ID: Erika [name], female   DOB: 1975-08-13, 36 y.o.   MRN: [id number] Chief Complaint Medication management and followup  History of presenting illness Patient is 36 year old [place] American female who came for her followup appointment with her mother.  Patient was recently seen her primary care physician Dr. Salome [name].  She had a blood work which shows hemoglobin 6.6.  I called her primary care physician and [place] side her medication.  She was started on metformin 500 mg once a day and gradually increase to twice a day .  I also received a fax from her primary care physician showing complete blood work.  Her hemoglobin A1c is 6.6.  Her basic chemistry was normal.  Her MCV is 75.1 and her MCH is 23.9.  Her triglyceride is 36 however her total cholesterol is 137 and LDL 79.  Overall patient is fairly stable but she is concerned about her hyperglycemia and new onset diabetes.  Patient has a good Thanksgiving, she went to [place] and had a good time however she felt short of breath due to excessive walking.  Patient admitted lack of exercise and not control on her diet ticking her more tired .  She denies any depressive thoughts, crying spells , paranoia or agitation.  She sleeps good.  She denies any agitation anger mood swing.  She denies any side effects of medication.  She's not drinking or using any illegal substance.  Past psychiatric history Patient has significant history of psychiatric illness started in her teens. Patient has at least 3 psychiatric hospitalization at [facility]. Most of psychiatric admissiondictated by paranoid thinking. She denies any history of suicidal attempt.  Family history Patient denies family history of psychiatric illness.   Psychosocial history Patient was born and raised in [place]. She also spent some life in [place] and currently living with her mother increased to the past many years. Patient has good social network and  extended family in this area.  Alcohol and substance use history Patient denies any history of alcohol or substance use.  Patient Active Problem List  Diagnosis  . Psychosis  . Diabetes  . GERD (gastroesophageal reflux disease)   Review of Systems  Constitutional: Negative for weight loss.       Obesity  HENT: Negative.   Gastrointestinal: Positive for heartburn.  Musculoskeletal: Positive for back pain.  Skin: Negative.   Neurological: Negative.   Psychiatric/Behavioral: Negative for depression, suicidal ideas, hallucinations and substance abuse. The patient is nervous/anxious. The patient does not have insomnia.    Mental status examination Patient is morbid obese female who is casually dressed and fairly groomed. She maintained fair eye contact.  She is cooperative and relevant in conversation. Her speech is slow but clear and coherent.  Her thought process is slow but logical linear and goal-directed.  She denies any auditory or visual hallucination.  She has poverty of thought content but she denies any paranoid thinking or delusions.  She denies any active or passive suicidal thinking and homicidal thinking.  Her attention and concentration is fair.  She's alert and oriented x3.  Her insight judgment and impulse control is okay.  Assessment Axis I schizophrenia chronic paranoid type, rule out MDD Axis II deferred Axis III see medical history Axis IV mild to moderate Axis V 55-60  Plan I review her blood work, progress note from her primary care physician and update her medication.  Patient is fairly stable on her current psychiatric medication  however due to recent hyperglycemia I discuss with the patient and her mother to try reducing the Abilify.  I recommend to try it Abilify 20 mg.  I have long discussion with the patient and her mother to discuss the possibility of side effects, relapse and reducing the blood sugar with reducing Abilify.  Patient and her mother acknowledged  and like to try reduce Abilify.  I recommend to call us if she feels worsening of the symptoms.  Otherwise I will see her again in 2 months.  More than 50% of time spent in psychoeducation counseling, coordination of care and review her blood work.

## 2012-04-04 ENCOUNTER — Encounter (HOSPITAL_COMMUNITY): Payer: Self-pay | Admitting: Psychiatry

## 2012-04-04 ENCOUNTER — Ambulatory Visit (INDEPENDENT_AMBULATORY_CARE_PROVIDER_SITE_OTHER): Payer: Medicaid Other | Admitting: Psychiatry

## 2012-04-04 VITALS — BP 144/82 | HR 78 | Wt 277.0 lb

## 2012-04-04 DIAGNOSIS — F29 Unspecified psychosis not due to a substance or known physiological condition: Secondary | ICD-10-CM

## 2012-04-04 DIAGNOSIS — F200 Paranoid schizophrenia: Secondary | ICD-10-CM

## 2012-04-04 MED ORDER — ARIPIPRAZOLE 20 MG PO TABS: 20.0000 mg | ORAL_TABLET | Freq: Every day | ORAL | Status: DC

## 2012-04-04 MED ORDER — SERTRALINE HCL 100 MG PO TABS: ORAL_TABLET | ORAL | Status: DC

## 2012-04-04 MED ORDER — TEMAZEPAM 15 MG PO CAPS: 15.0000 mg | ORAL_CAPSULE | Freq: Every day | ORAL | Status: DC

## 2012-04-04 NOTE — Progress Notes (Signed)
[facility] [id number] Progress Note  Erika [name] [id number] 37 y.o.  04/04/2012 3:44 PM  Chief Complaint: I need my medication.  History of Present Illness: Patient is 37 year old [place] female who came for her followup appointment with her mother.  She's compliant with the medication and denies any side effects.  Her weight remains unchanged however mother reported that her blood sugar is better.  She is checking her blood sugar every day.  Patient also cut down her calorie intake and hoping to lose weight.  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  She denies any paranoia or hallucination agitation irritability or any insomnia.  She feels her current medicine working very well.  Mother endorse that she is stable on her medication.  Patient has a limited contact with other people but does enjoy her volunteer work.  She's not drinking or using any illegal substance.  Suicidal Ideation: No Plan Formed: No Patient has means to carry out plan: No  Homicidal Ideation: No Plan Formed: No Patient has means to carry out plan: No  Review of Systems: Psychiatric: Agitation: No Hallucination: No Depressed Mood: No Insomnia: No Hypersomnia: No Altered Concentration: No Feels Worthless: No Grandiose Ideas: No Belief In Special Powers: No New/Increased Substance Abuse: No Compulsions: No  Neurologic: Headache: No Seizure: No Paresthesias: No  Past psychiatric history Patient has history of psychiatric illness started in her teens.  She is at least 3 psychiatric hospitalization at [facility].  She is total 6 psychiatric hospitalization.  She was diagnosed with schizophrenia .  Most of the psychiatric admission was precipitated by paranoid thinking and noncompliance with the medication.  Patient denies any history of suicidal attempt.    Medical history Patient has obesity, GERD and new onset diabetes mellitus.  Her primary care physician is Dr.  Rudean Hitt.   Social History: Patient lives with her mother.  Outpatient Encounter Prescriptions as of 04/04/2012  Medication Sig Dispense Refill  . ARIPiprazole (ABILIFY) 20 MG tablet Take 1 tablet (20 mg total) by mouth daily.  30 tablet  1  . esomeprazole (NEXIUM) 20 MG capsule Take 20 mg by mouth daily before breakfast.        . metFORMIN (GLUMETZA) 500 MG (MOD) 24 hr tablet Take 500 mg by mouth daily with breakfast.      . rosuvastatin (CRESTOR) 20 MG tablet Take 20 mg by mouth daily.        . sertraline (ZOLOFT) 100 MG tablet TAKE ONE & ONE-HALF TABLETS BY MOUTH EVERY DAY  45 tablet  1  . temazepam (RESTORIL) 15 MG capsule Take 1 capsule (15 mg total) by mouth at bedtime.  30 capsule  1    Past Psychiatric History/Hospitalization(s): Anxiety: No Bipolar Disorder: No Depression: Yes Mania: No Psychosis: Yes Schizophrenia: Yes Personality Disorder: No Hospitalization for psychiatric illness: Yes History of Electroconvulsive Shock Therapy: No Prior Suicide Attempts: No  Physical Exam: Constitutional:  There were no vitals taken for this visit.  General Appearance: well nourished and obese.  Cooperative and maintained fair eye contact.    Musculoskeletal: Strength & Muscle Tone: within normal limits Gait & Station: normal Patient leans: N/A  Psychiatric: Speech (describe rate, volume, coherence, spontaneity, and abnormalities if any): Soft and slow but normal tone and volume.  Nonspontaneous  Thought Process (describe rate, content, abstract reasoning, and computation): Slow but logical linear and goal-directed.  No tremors or shakes.  No flight of ideas  Associations: Relevant and Intact  Thoughts:  normal  Mental Status: Orientation: oriented to person, place and time/date Mood & Affect: normal affect Attention Span & Concentration: Fair  Medical Decision Making (Choose Three): Established Problem, Stable/Improving (1), Review of Last Therapy Session (1) and Review  of Medication Regimen & Side Effects (2)  Assessment: Axis I: Chronic Paranoid schizophrenia  Axis II: Deferred  Axis III: See medical history  Axis IV: Mild to moderate  Axis V: 50-55   Plan: I review her medication, side effects and response to the medication.  At this time patient is stable on her current psychiatric medication.  She has not able to lose weight however as her mother for sugar is better controlled.  I will continue current psychiatric medication.  Recommend to call us if she is any question or concern if he feel worsening of the symptom.  I will see her again in 3 months.  ARFEEN,SYED T., MD 04/04/2012

## 2012-04-10 ENCOUNTER — Other Ambulatory Visit (HOSPITAL_COMMUNITY): Payer: Self-pay | Admitting: Psychiatry

## 2012-06-01 ENCOUNTER — Encounter (HOSPITAL_COMMUNITY): Payer: Self-pay | Admitting: *Deleted

## 2012-06-01 NOTE — Progress Notes (Signed)
[place] [employer] authorized Restoril 15 mg #30/30 days -- "quantity override" Effective for 180 days: 05/25/12 to 11/21/12 Notified pharmacy and patient

## 2012-07-03 ENCOUNTER — Encounter (HOSPITAL_COMMUNITY): Payer: Self-pay | Admitting: Psychiatry

## 2012-07-03 ENCOUNTER — Ambulatory Visit (INDEPENDENT_AMBULATORY_CARE_PROVIDER_SITE_OTHER): Payer: Federal, State, Local not specified - Other | Admitting: Psychiatry

## 2012-07-03 DIAGNOSIS — F29 Unspecified psychosis not due to a substance or known physiological condition: Secondary | ICD-10-CM

## 2012-07-03 DIAGNOSIS — F200 Paranoid schizophrenia: Secondary | ICD-10-CM

## 2012-07-03 MED ORDER — TEMAZEPAM 15 MG PO CAPS: 15.0000 mg | ORAL_CAPSULE | Freq: Every day | ORAL | Status: DC

## 2012-07-03 MED ORDER — ARIPIPRAZOLE 15 MG PO TABS: 15.0000 mg | ORAL_TABLET | Freq: Every day | ORAL | Status: DC

## 2012-07-03 MED ORDER — SERTRALINE HCL 100 MG PO TABS: ORAL_TABLET | ORAL | Status: DC

## 2012-07-03 NOTE — Progress Notes (Signed)
[facility] [id number] Progress Note  Erika [name] [id number] 37 y.o.  07/03/2012 3:49 PM  Chief Complaint: I'm trying to lose my weight however I have no luck.  History of Present Illness: Patient is 37 year old [place] female who came for her followup appointment with her mother.  As per mother patient is trying to lose her weight.  She is doing regular exercise and watching her diet however she has difficulty losing weight.  She is going to her volunteer work regularly.  She's compliant with the medication.  She denies any recent agitation anger or mood swing.  She is more social in the community.  Patient denies any recent hallucination paranoia or any anger.  She has not begin using any illegal substance.  She saw primary care physician recently and her blood work was done.  Mother brought a letter from primary care physician Dr. Salome Spotted.  As per letter kidney test , liver tests and electrolytes are normal.  Hemoglobin A1c is 6.5.  Hemoglobin is 11.9.  Total cholesterol 138 and LDL 79.  Triglycerides 113 and HDL 36.  They do not receive actual blood test.  Patient was also started any medication for blood pressure however patient and her mother do not remember the dosage and the name of the medication.  Overall patient is doing very well.  She is concerned about her weight.  She is not drinking or using any illegal substance.  Suicidal Ideation: No Plan Formed: No Patient has means to carry out plan: No  Homicidal Ideation: No Plan Formed: No Patient has means to carry out plan: No  Review of Systems  Constitutional: Positive for malaise/fatigue.       Weight gain  HENT: Negative.   Eyes: Negative.   Respiratory: Negative.   Cardiovascular: Negative.   Gastrointestinal: Positive for heartburn.  Musculoskeletal: Negative.   Neurological: Negative.   Psychiatric/Behavioral: Negative for depression, suicidal ideas, hallucinations, memory loss and substance  abuse. The patient is nervous/anxious. The patient does not have insomnia.    Psychiatric: Agitation: No Hallucination: No Depressed Mood: No Insomnia: No Hypersomnia: No Altered Concentration: No Feels Worthless: No Grandiose Ideas: No Belief In Special Powers: No New/Increased Substance Abuse: No Compulsions: No  Neurologic: Headache: No Seizure: No Paresthesias: No  Past psychiatric history Patient has history of psychiatric illness started in her teens.  She is at least 3 psychiatric hospitalization at [facility].  She is total 6 psychiatric hospitalization.  She was diagnosed with schizophrenia .  Most of the psychiatric admission was precipitated by paranoid thinking and noncompliance with the medication.  Patient denies any history of suicidal attempt.    Medical history Patient has obesity, GERD and new onset diabetes mellitus.  Her primary care physician is Dr. Rudean Hitt.   Social History: Patient lives with her mother.  Outpatient Encounter Prescriptions as of 07/03/2012  Medication Sig Dispense Refill  . ARIPiprazole (ABILIFY) 15 MG tablet Take 1 tablet (15 mg total) by mouth daily.  30 tablet  2  . esomeprazole (NEXIUM) 20 MG capsule Take 20 mg by mouth daily before breakfast.        . metFORMIN (GLUMETZA) 500 MG (MOD) 24 hr tablet Take 500 mg by mouth daily with breakfast.      . rosuvastatin (CRESTOR) 20 MG tablet Take 20 mg by mouth daily.        . sertraline (ZOLOFT) 100 MG tablet TAKE ONE & ONE-HALF TABLETS BY MOUTH EVERY DAY  45  tablet  2  . temazepam (RESTORIL) 15 MG capsule Take 1 capsule (15 mg total) by mouth at bedtime.  30 capsule  2  . [DISCONTINUED] ARIPiprazole (ABILIFY) 20 MG tablet Take 1 tablet (20 mg total) by mouth daily.  30 tablet  2  . [DISCONTINUED] sertraline (ZOLOFT) 100 MG tablet TAKE ONE & ONE-HALF TABLETS BY MOUTH EVERY DAY  45 tablet  2  . [DISCONTINUED] temazepam (RESTORIL) 15 MG capsule Take 1 capsule (15 mg total) by  mouth at bedtime.  30 capsule  2   No facility-administered encounter medications on file as of 07/03/2012.    Past Psychiatric History/Hospitalization(s): Anxiety: No Bipolar Disorder: No Depression: Yes Mania: No Psychosis: Yes Schizophrenia: Yes Personality Disorder: No Hospitalization for psychiatric illness: Yes History of Electroconvulsive Shock Therapy: No Prior Suicide Attempts: No  Physical Exam: Constitutional:  BP 122/74  Pulse 88  Ht 5\' 3"  (1.6 m)  Wt 274 lb (124.286 kg)  BMI 48.55 kg/m2  General Appearance: well nourished and obese.  Cooperative and maintained fair eye contact.    Musculoskeletal: Strength & Muscle Tone: within normal limits Gait & Station: normal Patient leans: N/A  Psychiatric: Speech (describe rate, volume, coherence, spontaneity, and abnormalities if any): Soft and slow but normal tone and volume.  Nonspontaneous  Thought Process (describe rate, content, abstract reasoning, and computation): Slow but logical linear and goal-directed.  No tremors or shakes.  No flight of ideas  Associations: Relevant and Intact  Thoughts: normal  Mental Status: Orientation: oriented to person, place and time/date Mood & Affect: normal affect Attention Span & Concentration: Fair  Medical Decision Making (Choose Three): Established Problem, Stable/Improving (1), Review of Psycho-Social Stressors (1), Review or order clinical lab tests (1), Established Problem, Worsening (2), Review of Last Therapy Session (1), Review of Medication Regimen & Side Effects (2) and Review of New Medication or Change in Dosage (2)  Assessment: Axis I: Chronic Paranoid schizophrenia  Axis II: Deferred  Axis III: See medical history  Axis IV: Mild to moderate  Axis V: 50-55   Plan:  Patient continues to have struggling lowering her weight.  Despite exercise and watching her calorie intake she has not been succeed weight loss.  Psychiatrically the patient is doing very  well on Abilify, Zoloft and temazepam.  I recommend to try Abilify 15 mg if that can help with loss.  Patient and her mother agree.  I will continue temazepam and sertraline and present dose.  We will try Abilify 50 mg at bedtime.  Recommend] is a question of Concerta for worsening of the symptom.  I also asked if she can call us back about the blood pressure medication so he can add in her chart.  Recommend to call us back if any questions or concerns.  I will see her again in 3 months.  Time spent 25 minutes.  More than 50% of the time spent a psycho education, counseling and coordination of care.     Neera Teng T., MD 07/03/2012

## 2012-10-04 ENCOUNTER — Ambulatory Visit (HOSPITAL_COMMUNITY): Payer: Self-pay | Admitting: Psychiatry

## 2012-10-04 ENCOUNTER — Ambulatory Visit (INDEPENDENT_AMBULATORY_CARE_PROVIDER_SITE_OTHER): Payer: Federal, State, Local not specified - Other | Admitting: Psychiatry

## 2012-10-04 ENCOUNTER — Encounter (HOSPITAL_COMMUNITY): Payer: Self-pay | Admitting: Psychiatry

## 2012-10-04 VITALS — BP 129/89 | HR 68 | Ht 63.0 in | Wt 274.0 lb

## 2012-10-04 DIAGNOSIS — F200 Paranoid schizophrenia: Secondary | ICD-10-CM

## 2012-10-04 DIAGNOSIS — F29 Unspecified psychosis not due to a substance or known physiological condition: Secondary | ICD-10-CM

## 2012-10-04 MED ORDER — TEMAZEPAM 15 MG PO CAPS: 15.0000 mg | ORAL_CAPSULE | Freq: Every day | ORAL | Status: DC

## 2012-10-04 MED ORDER — ARIPIPRAZOLE 15 MG PO TABS: 15.0000 mg | ORAL_TABLET | Freq: Every day | ORAL | Status: DC

## 2012-10-04 MED ORDER — SERTRALINE HCL 100 MG PO TABS: ORAL_TABLET | ORAL | Status: DC

## 2012-10-04 NOTE — Progress Notes (Signed)
[facility] [id number] Progress Note  CAREENA DEGRAFFENREID [id number] 37 y.o.  10/04/2012 4:21 PM  Chief Complaint: Medication management and followup.  History of Present Illness: Patient is 37 year old [place] American female who came for her followup appointment with her mother.  Patient is compliant with her psychiatric medication.  She denies any agitation anger or mood swings.  She sleeping better.  She is doing regular exercise however her weight is unchanged from the past.  She is working a few times a week and more social in her daily life.  She has a tremors or shakes.  She is not drinking or using any illegal substance.  She is scheduled to see her primary care physician on August 28.  She will get blood work there.  Suicidal Ideation: No Plan Formed: No Patient has means to carry out plan: No  Homicidal Ideation: No Plan Formed: No Patient has means to carry out plan: No  Review of Systems  Constitutional: Negative.   HENT: Negative.   Eyes: Negative.   Respiratory: Negative.   Cardiovascular: Negative.   Gastrointestinal: Negative.   Musculoskeletal: Negative.   Neurological: Negative.   Psychiatric/Behavioral: Negative for depression, suicidal ideas, hallucinations, memory loss and substance abuse. The patient is nervous/anxious. The patient does not have insomnia.    Psychiatric: Agitation: No Hallucination: No Depressed Mood: No Insomnia: No Hypersomnia: No Altered Concentration: No Feels Worthless: No Grandiose Ideas: No Belief In Special Powers: No New/Increased Substance Abuse: No Compulsions: No  Neurologic: Headache: No Seizure: No Paresthesias: No  Past psychiatric history Patient has history of psychiatric illness started in her teens.  She is at least 3 psychiatric hospitalization at [facility].  She is total 6 psychiatric hospitalization.  She was diagnosed with schizophrenia .  Most of the psychiatric admission was  precipitated by paranoid thinking and noncompliance with the medication.  Patient denies any history of suicidal attempt.    Medical history Patient has obesity, GERD and new onset diabetes mellitus.  Her primary care physician is Dr. Rudean Hitt.   Social History: Patient lives with her mother.  Outpatient Encounter Prescriptions as of 10/04/2012  Medication Sig Dispense Refill  . ARIPiprazole (ABILIFY) 15 MG tablet Take 1 tablet (15 mg total) by mouth daily.  30 tablet  2  . esomeprazole (NEXIUM) 20 MG capsule Take 20 mg by mouth daily before breakfast.        . metFORMIN (GLUMETZA) 500 MG (MOD) 24 hr tablet Take 500 mg by mouth daily with breakfast.      . rosuvastatin (CRESTOR) 20 MG tablet Take 20 mg by mouth daily.        . sertraline (ZOLOFT) 100 MG tablet TAKE ONE & ONE-HALF TABLETS BY MOUTH EVERY DAY  45 tablet  2  . temazepam (RESTORIL) 15 MG capsule Take 1 capsule (15 mg total) by mouth at bedtime.  30 capsule  2  . [DISCONTINUED] ARIPiprazole (ABILIFY) 15 MG tablet Take 1 tablet (15 mg total) by mouth daily.  30 tablet  2  . [DISCONTINUED] sertraline (ZOLOFT) 100 MG tablet TAKE ONE & ONE-HALF TABLETS BY MOUTH EVERY DAY  45 tablet  2  . [DISCONTINUED] temazepam (RESTORIL) 15 MG capsule Take 1 capsule (15 mg total) by mouth at bedtime.  30 capsule  2   No facility-administered encounter medications on file as of 10/04/2012.    Past Psychiatric History/Hospitalization(s): Anxiety: No Bipolar Disorder: No Depression: Yes Mania: No Psychosis: Yes Schizophrenia: Yes Personality Disorder: No Hospitalization  for psychiatric illness: Yes History of Electroconvulsive Shock Therapy: No Prior Suicide Attempts: No  Physical Exam: Constitutional:  BP 129/89  Pulse 68  Ht 5\' 3"  (1.6 m)  Wt 274 lb (124.286 kg)  BMI 48.55 kg/m2  General Appearance: well nourished and obese.  Cooperative and maintained fair eye contact.    Musculoskeletal: Strength & Muscle Tone: within normal  limits Gait & Station: normal Patient leans: N/A  Psychiatric: Speech (describe rate, volume, coherence, spontaneity, and abnormalities if any): Soft and slow but normal tone and volume.  Nonspontaneous  Thought Process (describe rate, content, abstract reasoning, and computation): Slow but logical linear and goal-directed.  No tremors or shakes.  No flight of ideas  Associations: Relevant and Intact  Thoughts: normal  Mental Status: Orientation: oriented to person, place and time/date Mood & Affect: normal affect Attention Span & Concentration: Fair  Medical Decision Making (Choose Three): Established Problem, Stable/Improving (1), Review of Last Therapy Session (1) and Review of New Medication or Change in Dosage (2)  Assessment: Axis I: Chronic Paranoid schizophrenia  Axis II: Deferred  Axis III: See medical history  Axis IV: Mild to moderate  Axis V: 50-55   Plan:  I will continue Abilify 15 mg at bedtime along with temazepam and Zoloft the present dose.  Patient does not have any side effects.  Recommend to call us back if she has any question or concern.  Reminded that her blood work at her primary care physician can be faxed to [place] .  Followup in 3 months.  Tyan Dy T., MD 10/04/2012

## 2013-01-04 ENCOUNTER — Ambulatory Visit (HOSPITAL_COMMUNITY): Payer: Self-pay | Admitting: Psychiatry

## 2013-01-10 ENCOUNTER — Ambulatory Visit (INDEPENDENT_AMBULATORY_CARE_PROVIDER_SITE_OTHER): Payer: Federal, State, Local not specified - Other | Admitting: Psychiatry

## 2013-01-10 ENCOUNTER — Encounter (HOSPITAL_COMMUNITY): Payer: Self-pay | Admitting: Psychiatry

## 2013-01-10 VITALS — BP 128/74 | HR 88 | Wt 278.0 lb

## 2013-01-10 DIAGNOSIS — F200 Paranoid schizophrenia: Secondary | ICD-10-CM

## 2013-01-10 DIAGNOSIS — F29 Unspecified psychosis not due to a substance or known physiological condition: Secondary | ICD-10-CM

## 2013-01-10 MED ORDER — ARIPIPRAZOLE 15 MG PO TABS: 15.0000 mg | ORAL_TABLET | Freq: Every day | ORAL | Status: DC

## 2013-01-10 MED ORDER — SERTRALINE HCL 100 MG PO TABS: ORAL_TABLET | ORAL | Status: DC

## 2013-01-10 MED ORDER — TEMAZEPAM 15 MG PO CAPS: 15.0000 mg | ORAL_CAPSULE | Freq: Every day | ORAL | Status: DC

## 2013-01-10 NOTE — Progress Notes (Signed)
[facility] [id number] Progress Note  Erika [name] [id number] 37 y.o.  01/10/2013 4:23 PM  Chief Complaint: Medication management and followup.  History of Present Illness: Zoila gain for her followup appointment with her mother.  She is doing much better on her current medication.  She still has rage issues.  She is working and also Teacher, English as a foreign language work .  She is keeping herself busy and watching her diet closely however she still not able to lose weight.  Recently she saw her primary care physician Dr. Ludwig Clarks in her blood work were drawn.  Patient presented her blood work results.  She has a hemoglobin A1c 6.3 , total cholesterol 163, HDL 38 and LDL 103 .  Her kidney function test were normal.  Patient denies any mood swings, paranoia, anger or any education.  She sleeping better.  Mother endorse any active and social .  She has no tremors or shakes.  Patient does not want to change her medication.  She is not drinking or using any illegal substance.    Suicidal Ideation: No Plan Formed: No Patient has means to carry out plan: No  Homicidal Ideation: No Plan Formed: No Patient has means to carry out plan: No  Review of Systems  Psychiatric/Behavioral: Negative for depression, suicidal ideas, hallucinations, memory loss and substance abuse. The patient is nervous/anxious. The patient does not have insomnia.    Psychiatric: Agitation: No Hallucination: No Depressed Mood: No Insomnia: No Hypersomnia: No Altered Concentration: No Feels Worthless: No Grandiose Ideas: No Belief In Special Powers: No New/Increased Substance Abuse: No Compulsions: No  Neurologic: Headache: No Seizure: No Paresthesias: No  Past psychiatric history Patient has history of psychiatric illness started in her teens.  She is at least 3 psychiatric hospitalization at [facility].  She is total 6 psychiatric hospitalization.  She was diagnosed with schizophrenia .  Most of  the psychiatric admission was precipitated by paranoid thinking and noncompliance with the medication.  Patient denies any history of suicidal attempt.    Medical history Patient has obesity, GERD and new onset diabetes mellitus.  Her primary care physician is Dr. Rudean Hitt.   Social History: Patient lives with her mother.  Outpatient Encounter Prescriptions as of 01/10/2013  Medication Sig  . ARIPiprazole (ABILIFY) 15 MG tablet Take 1 tablet (15 mg total) by mouth daily.  Marland Kitchen esomeprazole (NEXIUM) 20 MG capsule Take 20 mg by mouth daily before breakfast.    . metFORMIN (GLUMETZA) 500 MG (MOD) 24 hr tablet Take 500 mg by mouth daily with breakfast.  . rosuvastatin (CRESTOR) 20 MG tablet Take 20 mg by mouth daily.    . sertraline (ZOLOFT) 100 MG tablet TAKE ONE & ONE-HALF TABLETS BY MOUTH EVERY DAY  . temazepam (RESTORIL) 15 MG capsule Take 1 capsule (15 mg total) by mouth at bedtime.  . [DISCONTINUED] ARIPiprazole (ABILIFY) 15 MG tablet Take 1 tablet (15 mg total) by mouth daily.  . [DISCONTINUED] sertraline (ZOLOFT) 100 MG tablet TAKE ONE & ONE-HALF TABLETS BY MOUTH EVERY DAY  . [DISCONTINUED] temazepam (RESTORIL) 15 MG capsule Take 1 capsule (15 mg total) by mouth at bedtime.    Past Psychiatric History/Hospitalization(s): Anxiety: No Bipolar Disorder: No Depression: Yes Mania: No Psychosis: Yes Schizophrenia: Yes Personality Disorder: No Hospitalization for psychiatric illness: Yes History of Electroconvulsive Shock Therapy: No Prior Suicide Attempts: No  Physical Exam: Constitutional:  BP 128/74  Pulse 88  Wt 278 lb (126.1 kg)  General Appearance: well nourished and obese.  Cooperative and maintained fair eye contact.    Musculoskeletal: Strength & Muscle Tone: within normal limits Gait & Station: normal Patient leans: N/A  Psychiatric: Speech (describe rate, volume, coherence, spontaneity, and abnormalities if any): Soft and slow but normal tone and volume.   Nonspontaneous  Thought Process (describe rate, content, abstract reasoning, and computation): Slow but logical linear and goal-directed.  No tremors or shakes.  No flight of ideas  Associations: Relevant and Intact  Thoughts: normal  Mental Status: Orientation: oriented to person, place and time/date Mood & Affect: normal affect Attention Span & Concentration: Fair  Medical Decision Making (Choose Three): Established Problem, Stable/Improving (1), Review of Last Therapy Session (1) and Review of New Medication or Change in Dosage (2)  Assessment: Axis I: Chronic Paranoid schizophrenia  Axis II: Deferred  Axis III: See medical history  Axis IV: Mild to moderate  Axis V: 50-55   Plan:  I recommended to contact her primary care physician for weight loss program since she is trying to exercise and watching her calories but unable to lose weight.  I will continue Abilify 15 mg at bedtime, temazepam 15 mg at bedtime and Zoloft 150 mg daily. Patient does not have any side effects.  Recommend to call us back if she has any question or concern.    Followup in 3 months.  Dyan Labarbera T., MD 01/10/2013

## 2013-04-12 ENCOUNTER — Encounter (HOSPITAL_COMMUNITY): Payer: Self-pay | Admitting: Psychiatry

## 2013-04-12 ENCOUNTER — Ambulatory Visit (INDEPENDENT_AMBULATORY_CARE_PROVIDER_SITE_OTHER): Payer: Federal, State, Local not specified - Other | Admitting: Psychiatry

## 2013-04-12 VITALS — BP 135/61 | HR 80 | Ht 63.0 in | Wt 273.0 lb

## 2013-04-12 DIAGNOSIS — F29 Unspecified psychosis not due to a substance or known physiological condition: Secondary | ICD-10-CM

## 2013-04-12 DIAGNOSIS — F200 Paranoid schizophrenia: Secondary | ICD-10-CM

## 2013-04-12 MED ORDER — TEMAZEPAM 15 MG PO CAPS: 15.0000 mg | ORAL_CAPSULE | Freq: Every day | ORAL | Status: DC

## 2013-04-12 MED ORDER — SERTRALINE HCL 100 MG PO TABS: ORAL_TABLET | ORAL | Status: DC

## 2013-04-12 MED ORDER — ARIPIPRAZOLE 15 MG PO TABS: 15.0000 mg | ORAL_TABLET | Freq: Every day | ORAL | Status: DC

## 2013-04-12 NOTE — Progress Notes (Signed)
[facility] [id number] Progress Note  Erika [name] [id number] 38 y.o.  04/12/2013 4:24 PM  Chief Complaint: Medication management and followup.  History of Present Illness: Erika [name] for her followup appointment with her mother.  She is compliant with her psychotropic medication.  She denied any side effects.  She had a good Christmas .  Her mother is trying to get her enrolled in the program where she can participate at least 2-3 times a week and physical sports.  Patient remained sometime very isolated and withdrawn but denies any depressive thoughts.  She denies any crying spells, irritability, paranoia or any hallucination.  She is scheduled to see her primary care physician in March .  Patient does not have any tremors or shakes.  She wants to continue her current psychotropic medication.  Suicidal Ideation: No Plan Formed: No Patient has means to carry out plan: No  Homicidal Ideation: No Plan Formed: No Patient has means to carry out plan: No  ROS Psychiatric: Agitation: No Hallucination: No Depressed Mood: No Insomnia: No Hypersomnia: No Altered Concentration: No Feels Worthless: No Grandiose Ideas: No Belief In Special Powers: No New/Increased Substance Abuse: No Compulsions: No  Neurologic: Headache: No Seizure: No Paresthesias: No  Medical history Patient has obesity, GERD and new onset diabetes mellitus.  Her primary care physician is Dr. Rudean HittMorriera.   Social History: Patient lives with her mother.  Outpatient Encounter Prescriptions as of 04/12/2013  Medication Sig  . ARIPiprazole (ABILIFY) 15 MG tablet Take 1 tablet (15 mg total) by mouth daily.  Marland Kitchen. esomeprazole (NEXIUM) 20 MG capsule Take 20 mg by mouth daily before breakfast.    . metFORMIN (GLUMETZA) 500 MG (MOD) 24 hr tablet Take 500 mg by mouth daily with breakfast.  . rosuvastatin (CRESTOR) 20 MG tablet Take 20 mg by mouth daily.    . sertraline (ZOLOFT) 100 MG tablet TAKE ONE & ONE-HALF  TABLETS BY MOUTH EVERY DAY  . temazepam (RESTORIL) 15 MG capsule Take 1 capsule (15 mg total) by mouth at bedtime.  . [DISCONTINUED] ARIPiprazole (ABILIFY) 15 MG tablet Take 1 tablet (15 mg total) by mouth daily.  . [DISCONTINUED] sertraline (ZOLOFT) 100 MG tablet TAKE ONE & ONE-HALF TABLETS BY MOUTH EVERY DAY  . [DISCONTINUED] temazepam (RESTORIL) 15 MG capsule Take 1 capsule (15 mg total) by mouth at bedtime.    Past Psychiatric History/Hospitalization(s): Patient has history of psychiatric illness started in her teens.  She is at least 3 psychiatric hospitalization at [facility].  She is total 6 psychiatric hospitalization.  She was diagnosed with schizophrenia .  Most of the psychiatric admission was precipitated by paranoid thinking and noncompliance with the medication.  Patient denies any history of suicidal attempt.   Anxiety: No Bipolar Disorder: No Depression: Yes Mania: No Psychosis: Yes Schizophrenia: Yes Personality Disorder: No Hospitalization for psychiatric illness: Yes History of Electroconvulsive Shock Therapy: No Prior Suicide Attempts: No  Physical Exam: Constitutional:  BP 135/61  Pulse 80  Ht 5\' 3"  (1.6 m)  Wt 273 lb (123.832 kg)  BMI 48.37 kg/m2  General Appearance: well nourished and obese.  Cooperative and maintained fair eye contact.    Musculoskeletal: Strength & Muscle Tone: within normal limits Gait & Station: normal Patient leans: N/A  Psychiatric: Speech (describe rate, volume, coherence, spontaneity, and abnormalities if any): Soft and slow but normal tone and volume.  Nonspontaneous  Thought Process (describe rate, content, abstract reasoning, and computation): Slow but logical linear and goal-directed.  No  tremors or shakes.  No flight of ideas  Associations: Relevant and Intact, fund of knowledge fair  Thoughts: normal, some thought blocking  Mental Status: Orientation: oriented to person, place and time/date Mood &  Affect: normal affect Attention Span & Concentration: Fair  Medical Decision Making (Choose Three): Established Problem, Stable/Improving (1), Review of Last Therapy Session (1) and Review of New Medication or Change in Dosage (2)  Assessment: Axis I: Chronic Paranoid schizophrenia  Axis II: Deferred  Axis III: See medical history  Axis IV: Mild to moderate  Axis V: 50-55   Plan:  Patient is doing better on her current psychotropic medication.  I will continue Abilify 15 mg at bedtime, temazepam 15 mg at bedtime and Zoloft 150 mg daily. Patient does not have any side effects.  Recommend to call us back if she has any question or concern.    Followup in 3 months.  Erika [name]., MD 04/12/2013

## 2013-07-02 ENCOUNTER — Other Ambulatory Visit: Payer: Self-pay | Admitting: Internal Medicine

## 2013-07-02 DIAGNOSIS — R519 Headache, unspecified: Secondary | ICD-10-CM

## 2013-07-02 DIAGNOSIS — R42 Dizziness and giddiness: Secondary | ICD-10-CM

## 2013-07-02 DIAGNOSIS — R51 Headache: Principal | ICD-10-CM

## 2013-07-11 ENCOUNTER — Ambulatory Visit (INDEPENDENT_AMBULATORY_CARE_PROVIDER_SITE_OTHER): Payer: Medicaid Other | Admitting: Psychiatry

## 2013-07-11 ENCOUNTER — Encounter (HOSPITAL_COMMUNITY): Payer: Self-pay | Admitting: Psychiatry

## 2013-07-11 VITALS — BP 142/95 | HR 78 | Ht 64.0 in | Wt 280.6 lb

## 2013-07-11 DIAGNOSIS — F29 Unspecified psychosis not due to a substance or known physiological condition: Secondary | ICD-10-CM

## 2013-07-11 DIAGNOSIS — F200 Paranoid schizophrenia: Secondary | ICD-10-CM

## 2013-07-11 MED ORDER — SERTRALINE HCL 100 MG PO TABS: ORAL_TABLET | ORAL | Status: DC

## 2013-07-11 MED ORDER — TEMAZEPAM 15 MG PO CAPS: 15.0000 mg | ORAL_CAPSULE | Freq: Every day | ORAL | Status: DC

## 2013-07-11 MED ORDER — ARIPIPRAZOLE 10 MG PO TABS: 10.0000 mg | ORAL_TABLET | Freq: Every day | ORAL | Status: DC

## 2013-07-11 NOTE — Progress Notes (Addendum)
[facility] [id number] Progress Note  Erika [name] [id number] 38 y.o.  07/11/2013 3:45 PM  Chief Complaint: Medication management and followup.  History of Present Illness: Erika [name] came for her followup appointment with her mother.  She is compliant with her psychotropic medication.  She recently seen her primary care physician Dr. Ludwig ClarksMoreira and she brought the results.  Her hemoglobin A1c is 6.8 .  Her hemoglobin is 0.1 her platelet count was 247.  Her kidney and electrolytes were normal.  Her cholesterol was 188 and her LDL was 150.  Her TSH was normal.  She was complaining of dizziness and headaches and she was recommended CT scan and sleep studies.  Overall patient is doing better.  She denies any irritability, anger, mood swing.  Her depressions and psychosis as well controlled.  She denies any tremors or shakes.  She is to have difficulty reducing her weight.  She is going to volunteer work.  She is not drinking or using any illegal substances.  Her vital stable but patient has weight gain.  Suicidal Ideation: No Plan Formed: No Patient has means to carry out plan: No  Homicidal Ideation: No Plan Formed: No Patient has means to carry out plan: No  ROS Psychiatric: Agitation: No Hallucination: No Depressed Mood: No Insomnia: No Hypersomnia: No Altered Concentration: No Feels Worthless: No Grandiose Ideas: No Belief In Special Powers: No New/Increased Substance Abuse: No Compulsions: No  Neurologic: Headache: No Seizure: No Paresthesias: No  Medical history Patient has obesity, GERD and new onset diabetes mellitus.  Her primary care physician is Dr. Rudean HittMorriera.   Social History: Patient lives with her mother.  Outpatient Encounter Prescriptions as of 07/11/2013  Medication Sig  . ARIPiprazole (ABILIFY) 10 MG tablet Take 1 tablet (10 mg total) by mouth daily.  Marland Kitchen. esomeprazole (NEXIUM) 20 MG capsule Take 20 mg by mouth daily before breakfast.    . metFORMIN  (GLUMETZA) 500 MG (MOD) 24 hr tablet Take 500 mg by mouth daily with breakfast.  . rosuvastatin (CRESTOR) 20 MG tablet Take 20 mg by mouth daily.    . sertraline (ZOLOFT) 100 MG tablet TAKE ONE & ONE-HALF TABLETS BY MOUTH EVERY DAY  . temazepam (RESTORIL) 15 MG capsule Take 1 capsule (15 mg total) by mouth at bedtime.  . [DISCONTINUED] ARIPiprazole (ABILIFY) 15 MG tablet Take 1 tablet (15 mg total) by mouth daily.  . [DISCONTINUED] sertraline (ZOLOFT) 100 MG tablet TAKE ONE & ONE-HALF TABLETS BY MOUTH EVERY DAY  . [DISCONTINUED] temazepam (RESTORIL) 15 MG capsule Take 1 capsule (15 mg total) by mouth at bedtime.    Past Psychiatric History/Hospitalization(s): Patient has history of psychiatric illness started in her teens.  She is at least 3 psychiatric hospitalization [facility].  She is total 6 psychiatric hospitalization.  She was diagnosed with schizophrenia .  Most of the psychiatric admission was precipitated by paranoid thinking and noncompliance with the medication.  Patient denies any history of suicidal attempt.   Anxiety: No Bipolar Disorder: No Depression: Yes Mania: No Psychosis: Yes Schizophrenia: Yes Personality Disorder: No Hospitalization for psychiatric illness: Yes History of Electroconvulsive Shock Therapy: No Prior Suicide Attempts: No  Physical Exam: Constitutional:  BP 142/95  Pulse 78  Ht 5\' 4"  (1.626 m)  Wt 280 lb 9.6 oz (127.279 kg)  BMI 48.14 kg/m2  General Appearance: well nourished and obese.  Cooperative and maintained fair eye contact.    Musculoskeletal: Strength & Muscle Tone: within normal limits Gait & Station:  normal Patient leans: N/A  Psychiatric: Speech (describe rate, volume, coherence, spontaneity, and abnormalities if any): Soft and slow but normal tone and volume.  Nonspontaneous  Thought Process (describe rate, content, abstract reasoning, and computation): Slow but logical linear and goal-directed.  No tremors  or shakes.  No flight of ideas  Associations: Relevant and Intact, fund of knowledge fair  Thoughts: normal, some thought blocking  Mental Status: Orientation: oriented to person, place and time/date Mood & Affect: normal affect Attention Span & Concentration: Fair  Established Problem, Stable/Improving (1), Review of Last Therapy Session (1) and Review of New Medication or Change in Dosage (2)  Assessment: Axis I: Chronic Paranoid schizophrenia  Axis II: Deferred  Axis III: See medical history  Axis IV: Mild to moderate  Axis V: 50-55   Plan:  I recommended to try lowering the Abilify to help her hemoglobin A1c and weight .  Patient is willing to try in her mother agreed with the plan.  I will try Abilify 10 mg daily and continue temazepam 15 mg at bedtime and Zoloft 150 mg daily. Patient does not have any side effects.  Recommend to call us back if she has any question or concern.    Followup in 3 months.  Faun Mcqueen T., MD 07/11/2013

## 2013-07-16 ENCOUNTER — Inpatient Hospital Stay: Admission: RE | Admit: 2013-07-16 | Payer: Self-pay | Source: Ambulatory Visit

## 2013-08-09 ENCOUNTER — Encounter: Payer: Self-pay | Admitting: Internal Medicine

## 2013-10-11 ENCOUNTER — Ambulatory Visit (HOSPITAL_COMMUNITY): Payer: Self-pay | Admitting: Psychiatry

## 2013-10-30 ENCOUNTER — Ambulatory Visit (INDEPENDENT_AMBULATORY_CARE_PROVIDER_SITE_OTHER): Payer: Medicaid Other | Admitting: Psychiatry

## 2013-10-30 ENCOUNTER — Encounter (HOSPITAL_COMMUNITY): Payer: Self-pay | Admitting: Psychiatry

## 2013-10-30 VITALS — BP 171/92 | HR 90 | Wt 281.6 lb

## 2013-10-30 DIAGNOSIS — F200 Paranoid schizophrenia: Secondary | ICD-10-CM

## 2013-10-30 DIAGNOSIS — F29 Unspecified psychosis not due to a substance or known physiological condition: Secondary | ICD-10-CM

## 2013-10-30 MED ORDER — SERTRALINE HCL 100 MG PO TABS: ORAL_TABLET | ORAL | Status: DC

## 2013-10-30 MED ORDER — ARIPIPRAZOLE 10 MG PO TABS: 10.0000 mg | ORAL_TABLET | Freq: Every day | ORAL | Status: DC

## 2013-10-30 MED ORDER — TEMAZEPAM 15 MG PO CAPS: 15.0000 mg | ORAL_CAPSULE | Freq: Every day | ORAL | Status: DC

## 2013-10-30 NOTE — Progress Notes (Signed)
[facility] [address] Progress Note  Erika [name] [id number] 38 y.o.  10/30/2013 4:14 PM  Chief Complaint: Medication management and followup.  History of Present Illness: Erika [name] came for her followup appointment with her mother.  She is taking her medication as prescribed.  She is sleeping good.  She denies any irritability, anger, mood swings.  Her recent blood work shows hemoglobin A1c 6.5 .  Her cholesterol is  133 and her BUN and creatinine is normal.  Her liver enzymes were also normal.  Her blood was drawn on August 5.  Overall her mood has been stable.  She denies any paranoia or any hallucination.  She continues to have a struggle leaving her house and doing regular exercise.  Her mother is retired this year and they are thinking to buy a condo in [place] .  Patient is going with the mother in a few weeks to see the place.  Patient does not have a side effects of medication.  She is not drinking or using any illegal substances.  Her vitals are stable.  Suicidal Ideation: No Plan Formed: No Patient has means to carry out plan: No  Homicidal Ideation: No Plan Formed: No Patient has means to carry out plan: No  ROS Psychiatric: Agitation: No Hallucination: No Depressed Mood: No Insomnia: No Hypersomnia: No Altered Concentration: No Feels Worthless: No Grandiose Ideas: No Belief In Special Powers: No New/Increased Substance Abuse: No Compulsions: No  Neurologic: Headache: No Seizure: No Paresthesias: No  Medical history Patient has obesity, GERD and new onset diabetes mellitus.  Her primary care physician is Dr. Rudean Hitt.   Social History: Patient lives with her mother.  Outpatient Encounter Prescriptions as of 10/30/2013  Medication Sig  . ARIPiprazole (ABILIFY) 10 MG tablet Take 1 tablet (10 mg total) by mouth daily.  Marland Kitchen esomeprazole (NEXIUM) 20 MG capsule Take 20 mg by mouth daily before breakfast.    . metFORMIN (GLUMETZA) 500 MG (MOD) 24 hr tablet  Take 500 mg by mouth daily with breakfast.  . rosuvastatin (CRESTOR) 20 MG tablet Take 20 mg by mouth daily.    . sertraline (ZOLOFT) 100 MG tablet TAKE ONE & ONE-HALF TABLETS BY MOUTH EVERY DAY  . temazepam (RESTORIL) 15 MG capsule Take 1 capsule (15 mg total) by mouth at bedtime.  . [DISCONTINUED] ARIPiprazole (ABILIFY) 10 MG tablet Take 1 tablet (10 mg total) by mouth daily.  . [DISCONTINUED] sertraline (ZOLOFT) 100 MG tablet TAKE ONE & ONE-HALF TABLETS BY MOUTH EVERY DAY  . [DISCONTINUED] temazepam (RESTORIL) 15 MG capsule Take 1 capsule (15 mg total) by mouth at bedtime.    Past Psychiatric History/Hospitalization(s): Patient has history of psychiatric illness started in her teens.  She is at least 3 psychiatric hospitalization at [facility].  She is total 6 psychiatric hospitalization.  She was diagnosed with schizophrenia .  Most of the psychiatric admission was precipitated by paranoid thinking and noncompliance with the medication.  Patient denies any history of suicidal attempt.   Anxiety: No Bipolar Disorder: No Depression: Yes Mania: No Psychosis: Yes Schizophrenia: Yes Personality Disorder: No Hospitalization for psychiatric illness: Yes History of Electroconvulsive Shock Therapy: No Prior Suicide Attempts: No  Physical Exam: Constitutional:  BP 171/92  Pulse 90  Wt 281 lb 9.6 oz (127.733 kg)  General Appearance: well nourished and obese.  Cooperative and maintained fair eye contact.    Musculoskeletal: Strength & Muscle Tone: within normal limits Gait & Station: normal Patient leans: N/A  Psychiatric: Speech (  describe rate, volume, coherence, spontaneity, and abnormalities if any): Soft and slow but normal tone and volume.  Nonspontaneous  Thought Process (describe rate, content, abstract reasoning, and computation): Slow but logical linear and goal-directed.  No tremors or shakes.  No flight of ideas  Associations: Relevant and Intact, fund of  knowledge fair  Thoughts: normal, some thought blocking  Mental Status: Orientation: oriented to person, place and time/date Mood & Affect: normal affect Attention Span & Concentration: Fair  Established Problem, Stable/Improving (1), Review or order clinical lab tests (1), Review of Last Therapy Session (1) and Review of New Medication or Change in Dosage (2)  Assessment: Axis I: Chronic Paranoid schizophrenia  Axis II: Deferred  Axis III: See medical history  Axis IV: Mild to moderate  Axis V: 50-55   Plan:  Patient is doing better on her current medication.  Recommended to continue Abilify 10 mg daily, Zoloft 150 mg daily and temazepam 15 mg at bedtime.  Discussed risks and benefits of medication.  Recommended to call us back if she has any question.  I will see her again in 6 months.  Erika [name] T., MD 10/30/2013

## 2014-04-30 ENCOUNTER — Encounter (HOSPITAL_COMMUNITY): Payer: Self-pay | Admitting: Psychiatry

## 2014-04-30 ENCOUNTER — Ambulatory Visit (INDEPENDENT_AMBULATORY_CARE_PROVIDER_SITE_OTHER): Payer: Medicaid Other | Admitting: Psychiatry

## 2014-04-30 VITALS — BP 111/69 | HR 66 | Ht 63.0 in | Wt 274.0 lb

## 2014-04-30 DIAGNOSIS — F200 Paranoid schizophrenia: Secondary | ICD-10-CM

## 2014-04-30 MED ORDER — ARIPIPRAZOLE 10 MG PO TABS: 10.0000 mg | ORAL_TABLET | Freq: Every day | ORAL | Status: DC

## 2014-04-30 MED ORDER — SERTRALINE HCL 100 MG PO TABS: ORAL_TABLET | ORAL | Status: DC

## 2014-04-30 MED ORDER — TEMAZEPAM 15 MG PO CAPS: 15.0000 mg | ORAL_CAPSULE | Freq: Every day | ORAL | Status: DC

## 2014-04-30 NOTE — Progress Notes (Signed)
[facility] [address] Progress Note  Erika [name] [id number] 39 y.o.  04/30/2014 4:17 PM  Chief Complaint: Medication management and followup.  History of Present Illness: Erika [name] came for her followup appointment with her mother.  She is compliant with her medication and denies any side effects.  She sleeping good.  She denies any paranoia or any hallucination.  She is scheduled to see her primary care physician next week and she will get blood work and physical.  Her mother is happy because she is more active social and interactive.  Patient also reported increased energy level.  She does not want to change her medication.  Her appetite is okay.  Her vitals are stable.    Suicidal Ideation: No Plan Formed: No Patient has means to carry out plan: No  Homicidal Ideation: No Plan Formed: No Patient has means to carry out plan: No  ROS Psychiatric: Agitation: No Hallucination: No Depressed Mood: No Insomnia: No Hypersomnia: No Altered Concentration: No Feels Worthless: No Grandiose Ideas: No Belief In Special Powers: No New/Increased Substance Abuse: No Compulsions: No  Neurologic: Headache: No Seizure: No Paresthesias: No  Medical history Patient has obesity, GERD and new onset diabetes mellitus.  Her primary care physician is Dr. Rudean Hitt.   Social History: Patient lives with her mother.  Outpatient Encounter Prescriptions as of 04/30/2014  Medication Sig  . ARIPiprazole (ABILIFY) 10 MG tablet Take 1 tablet (10 mg total) by mouth daily.  Marland Kitchen esomeprazole (NEXIUM) 20 MG capsule Take 20 mg by mouth daily before breakfast.    . metFORMIN (GLUMETZA) 500 MG (MOD) 24 hr tablet Take 500 mg by mouth daily with breakfast.  . rosuvastatin (CRESTOR) 20 MG tablet Take 20 mg by mouth daily.    . sertraline (ZOLOFT) 100 MG tablet TAKE ONE & ONE-HALF TABLETS BY MOUTH EVERY DAY  . temazepam (RESTORIL) 15 MG capsule Take 1 capsule (15 mg total) by mouth at bedtime.  .  [DISCONTINUED] ARIPiprazole (ABILIFY) 10 MG tablet Take 1 tablet (10 mg total) by mouth daily.  . [DISCONTINUED] sertraline (ZOLOFT) 100 MG tablet TAKE ONE & ONE-HALF TABLETS BY MOUTH EVERY DAY  . [DISCONTINUED] temazepam (RESTORIL) 15 MG capsule Take 1 capsule (15 mg total) by mouth at bedtime.    Past Psychiatric History/Hospitalization(s): Patient has history of psychiatric illness started in her teens.  She is at least 3 psychiatric hospitalization at [facility].  She is total 6 psychiatric hospitalization.  She was diagnosed with schizophrenia .  Most of the psychiatric admission was precipitated by paranoid thinking and noncompliance with the medication.  Patient denies any history of suicidal attempt.   Anxiety: No Bipolar Disorder: No Depression: Yes Mania: No Psychosis: Yes Schizophrenia: Yes Personality Disorder: No Hospitalization for psychiatric illness: Yes History of Electroconvulsive Shock Therapy: No Prior Suicide Attempts: No  Physical Exam: Constitutional:  BP 111/69 mmHg  Pulse 66  Ht  (1.6 m)  Wt 274 lb (124.286 kg)  BMI 48.55 kg/m2  General Appearance: well nourished and obese.  Cooperative and maintained fair eye contact.    Musculoskeletal: Strength & Muscle Tone: within normal limits Gait & Station: normal Patient leans: N/A  Psychiatric: Speech (describe rate, volume, coherence, spontaneity, and abnormalities if any): Soft and slow but normal tone and volume.  Nonspontaneous  Thought Process (describe rate, content, abstract reasoning, and computation): Slow but logical linear and goal-directed.  No tremors or shakes.  No flight of ideas  Associations: Relevant and Intact, fund of  knowledge fair  Thoughts: normal, some thought blocking  Mental Status: Orientation: oriented to person, place and time/date Mood & Affect: normal affect Attention Span & Concentration: Fair  Established Problem, Stable/Improving (1), Review or  order clinical lab tests (1), Review of Last Therapy Session (1) and Review of New Medication or Change in Dosage (2)  Assessment: Axis I: Chronic Paranoid schizophrenia  Axis II: Deferred  Axis III: See medical history   Plan:  Patient is stable on her current medication.  I recommended have her blood work faxed to us when she saw her primary care physician.  Continue  Abilify 10 mg daily, Zoloft 150 mg daily and temazepam 15 mg at bedtime.  Discussed risks and benefits of medication.  Recommended to call us back if she has any question.  I will see her again in 6 months.  Erika [name] T., MD 04/30/2014

## 2014-10-31 ENCOUNTER — Encounter (HOSPITAL_COMMUNITY): Payer: Self-pay | Admitting: Psychiatry

## 2014-10-31 ENCOUNTER — Ambulatory Visit (INDEPENDENT_AMBULATORY_CARE_PROVIDER_SITE_OTHER): Payer: Medicaid Other | Admitting: Psychiatry

## 2014-10-31 VITALS — BP 124/60 | HR 73 | Ht 64.0 in | Wt 277.0 lb

## 2014-10-31 DIAGNOSIS — F200 Paranoid schizophrenia: Secondary | ICD-10-CM

## 2014-10-31 MED ORDER — SERTRALINE HCL 100 MG PO TABS: ORAL_TABLET | ORAL | Status: DC

## 2014-10-31 MED ORDER — TEMAZEPAM 15 MG PO CAPS: 15.0000 mg | ORAL_CAPSULE | Freq: Every day | ORAL | Status: DC

## 2014-10-31 MED ORDER — ARIPIPRAZOLE 10 MG PO TABS
10.0000 mg | ORAL_TABLET | Freq: Every day | ORAL | Status: DC
Start: 2014-10-31 — End: 2014-11-07

## 2014-10-31 NOTE — Progress Notes (Signed)
[facility] [address] Progress Note  Erika [name] [id number] 39 y.o.  10/31/2014 3:38 PM  Chief Complaint: Medication management and followup.  History of Present Illness: Erika [name] came for her followup appointment with her mother.  She is taking her medication as prescribed.  She denies any side effects.  Her mother endorse that she is spending more time reading books.  She has a good summer.  She enjoyed a in the shopping or reading books.  She denies any hallucination, irritability, paranoia or any crying spells.  Her sleep is good.  She has no side effects including any tremors or shakes.  Today she went to see her primary care physician and she has blood work.  Patient told that her primary care physician Dr. Rudean Hitt is going to send the blood work results today.  Patient denies any EPS or shakes.  Her energy level is good.  Her appetite is okay.  Her vitals are stable.  Patient denies drinking or using any illegal substances.  Suicidal Ideation: No Plan Formed: No Patient has means to carry out plan: No  Homicidal Ideation: No Plan Formed: No Patient has means to carry out plan: No  ROS Psychiatric: Agitation: No Hallucination: No Depressed Mood: No Insomnia: No Hypersomnia: No Altered Concentration: No Feels Worthless: No Grandiose Ideas: No Belief In Special Powers: No New/Increased Substance Abuse: No Compulsions: No  Neurologic: Headache: No Seizure: No Paresthesias: No  Medical history Patient has obesity, GERD and new onset diabetes mellitus.  Her primary care physician is Dr. Rudean Hitt.   Social History: Patient lives with her mother.  Outpatient Encounter Prescriptions as of 10/31/2014  Medication Sig  . ARIPiprazole (ABILIFY) 10 MG tablet Take 1 tablet (10 mg total) by mouth daily.  Marland Kitchen esomeprazole (NEXIUM) 20 MG capsule Take 20 mg by mouth daily before breakfast.    . metFORMIN (GLUMETZA) 500 MG (MOD) 24 hr tablet Take 500 mg by mouth daily with  breakfast.  . rosuvastatin (CRESTOR) 20 MG tablet Take 20 mg by mouth daily.    . sertraline (ZOLOFT) 100 MG tablet TAKE ONE & ONE-HALF TABLETS BY MOUTH EVERY DAY  . temazepam (RESTORIL) 15 MG capsule Take 1 capsule (15 mg total) by mouth at bedtime.  . [DISCONTINUED] ARIPiprazole (ABILIFY) 10 MG tablet Take 1 tablet (10 mg total) by mouth daily.  . [DISCONTINUED] sertraline (ZOLOFT) 100 MG tablet TAKE ONE & ONE-HALF TABLETS BY MOUTH EVERY DAY  . [DISCONTINUED] temazepam (RESTORIL) 15 MG capsule Take 1 capsule (15 mg total) by mouth at bedtime.   No facility-administered encounter medications on file as of 10/31/2014.    Past Psychiatric History/Hospitalization(s): Patient has history of psychiatric illness started in her teens.  She is at least 3 psychiatric hospitalization at [facility].  She is total 6 psychiatric hospitalization.  She was diagnosed with schizophrenia .  Most of the psychiatric admission was precipitated by paranoid thinking and noncompliance with the medication.  Patient denies any history of suicidal attempt.   Anxiety: No Bipolar Disorder: No Depression: Yes Mania: No Psychosis: Yes Schizophrenia: Yes Personality Disorder: No Hospitalization for psychiatric illness: Yes History of Electroconvulsive Shock Therapy: No Prior Suicide Attempts: No  Physical Exam: Constitutional:  BP 124/60 mmHg  Pulse 73  Ht  (1.626 m)  Wt 277 lb (125.646 kg)  BMI 47.52 kg/m2  General Appearance: well nourished and obese.  Cooperative and maintained fair eye contact.    Musculoskeletal: Strength & Muscle Tone: within normal limits Gait &  Station: normal Patient leans: N/A  Psychiatric: Speech (describe rate, volume, coherence, spontaneity, and abnormalities if any): Soft and slow but normal tone and volume.  Nonspontaneous  Thought Process (describe rate, content, abstract reasoning, and computation): Slow but logical linear and goal-directed.  No  tremors or shakes.  No flight of ideas  Associations: Relevant and Intact, fund of knowledge fair  Thoughts: normal, some thought blocking  Mental Status: Orientation: oriented to person, place and time/date Mood & Affect: normal affect Attention Span & Concentration: Fair  Established Problem, Stable/Improving (1), Review or order clinical lab tests (1), Review of Last Therapy Session (1) and Review of New Medication or Change in Dosage (2)  Assessment: Axis I: Chronic Paranoid schizophrenia  Axis II: Deferred  Axis III: See medical history   Plan:  Review current medication.  She is stable on her current medication.  Patient and her mother does not want to change her medication.  She has no tremors.  I will continue Abilify 10 mg daily, Zoloft 150 mg daily and temazepam 15 mg at bedtime.  Recommended to call us back if she has any question or any concern.  We will follow-up on blood results from her primary care physician.  Follow-up in 6 months.  Farrell Pantaleo T., MD 10/31/2014

## 2014-11-02 ENCOUNTER — Other Ambulatory Visit (HOSPITAL_COMMUNITY): Payer: Self-pay | Admitting: Psychiatry

## 2014-11-07 ENCOUNTER — Telehealth (HOSPITAL_COMMUNITY): Payer: Self-pay

## 2014-11-07 DIAGNOSIS — F200 Paranoid schizophrenia: Secondary | ICD-10-CM

## 2014-11-07 MED ORDER — ARIPIPRAZOLE 10 MG PO TABS: 10.0000 mg | ORAL_TABLET | Freq: Every day | ORAL | Status: DC

## 2014-11-07 NOTE — Telephone Encounter (Signed)
Medication management - Telephone call with [employer] to assist with having patient's Abilfy approved.  Prior authorization completed earlier this date with Patience at [facility]. Approved till 11/02/15, FU#[id number].  Telephone message left with patient's Mother to inform prior authorization had been completed and medication could be filled as ordered. Met with Dr. Adele Schilder to discuss with him Medicaid approved patient's Abilify for 30 a month so he authorized a new 30 day order be sent to patient's [facility] plus 5 refills.   New order for patient's Abilify 51m, one daily, #30 plus 5 refills e-scribed to patient's WW[place]and called [facility]rae Alfred pharmacist to make sure they used this new prescription and gave them patient's authorization #[id number]  Alfred, pharmacist called back and reported the medication was approved but they were having a computer issue with [facility] trying to fill the medication.  Pharmacist reported they were working to get this filled but if could not they would transfer order to a another pharmacy for patient to fill.  Called Ms. CChildersback and informed filing the medication at this point was not an authorization issue but a computer issue with [facility].  Ms. CMoorehouseagreed to call them to make sure medication filled before going there or to transfer to another pharmacy if needed. Will call back if any further problems.

## 2014-11-07 NOTE — Telephone Encounter (Signed)
Medication problem - Telephone call from Gaspar Garbe, pharmacist at [facility] who reported they would have to change pt's Abilify to name brand as this is what was aprproved.  Verified this change appropriate with Dr. Lolly Mustache and gave verbal order for change.

## 2015-05-05 ENCOUNTER — Ambulatory Visit (INDEPENDENT_AMBULATORY_CARE_PROVIDER_SITE_OTHER): Payer: Medicaid Other | Admitting: Psychiatry

## 2015-05-05 ENCOUNTER — Encounter (HOSPITAL_COMMUNITY): Payer: Self-pay | Admitting: Psychiatry

## 2015-05-05 VITALS — BP 136/92 | HR 81 | Ht 65.0 in | Wt 273.2 lb

## 2015-05-05 DIAGNOSIS — F200 Paranoid schizophrenia: Secondary | ICD-10-CM | POA: Diagnosis not present

## 2015-05-05 MED ORDER — ARIPIPRAZOLE 10 MG PO TABS: 10.0000 mg | ORAL_TABLET | Freq: Every day | ORAL | Status: DC

## 2015-05-05 MED ORDER — TEMAZEPAM 15 MG PO CAPS: 15.0000 mg | ORAL_CAPSULE | Freq: Every day | ORAL | Status: DC

## 2015-05-05 MED ORDER — SERTRALINE HCL 100 MG PO TABS: ORAL_TABLET | ORAL | Status: DC

## 2015-05-05 NOTE — Progress Notes (Signed)
[facility] [id number] Progress Note  Lloyd Hugerawona N Say [id number] 40 y.o.  05/05/2015 4:15 PM  Chief Complaint: Medication management and followup.  History of Present Illness: Lynelle Doctorawona came for her followup appointment with her mother.  She has been compliant with the medication and denies any side effects.  She had a very good Christmas and she went to self: Mental visit her uncle.  She had a good time.  She recently seen her primary care physician Dr. Ralene Okoy Moreira .  She has blood work and her hemoglobin A1c is 7.1.  Her diabetic medications were changed and now she is taking Snjanta and also she was added blood pressure medication.  She is not sure what brought pressure medication added but patient and her mother endorse that she is losing weight and her blood pressure is getting better.  Patient had a good energy level.  She denies any irritability, anger, paranoia or any hallucination.  Her appetite is okay.  She has no tremors or shakes.  She denies drinking or using any illegal substances.  Suicidal Ideation: No Plan Formed: No Patient has means to carry out plan: No  Homicidal Ideation: No Plan Formed: No Patient has means to carry out plan: No  ROS Psychiatric: Agitation: No Hallucination: No Depressed Mood: No Insomnia: No Hypersomnia: No Altered Concentration: No Feels Worthless: No Grandiose Ideas: No Belief In Special Powers: No New/Increased Substance Abuse: No Compulsions: No  Neurologic: Headache: No Seizure: No Paresthesias: No  Medical history Patient has obesity, GERD and new onset diabetes mellitus.  Her primary care physician is Dr. Rudean HittMorriera.   Social History: Patient lives with her mother.  Outpatient Encounter Prescriptions as of 05/05/2015  Medication Sig  . Empagliflozin-Metformin HCl (SYNJARDY) 12.5-500 MG TABS Take by mouth 2 (two) times daily.  . ARIPiprazole (ABILIFY) 10 MG tablet Take 1 tablet (10 mg total) by mouth daily.  Marland Kitchen.  esomeprazole (NEXIUM) 20 MG capsule Take 20 mg by mouth daily before breakfast.    . rosuvastatin (CRESTOR) 20 MG tablet Take 20 mg by mouth daily.    . sertraline (ZOLOFT) 100 MG tablet TAKE ONE & ONE-HALF TABLETS BY MOUTH EVERY DAY  . temazepam (RESTORIL) 15 MG capsule Take 1 capsule (15 mg total) by mouth at bedtime.  . [DISCONTINUED] ARIPiprazole (ABILIFY) 10 MG tablet Take 1 tablet (10 mg total) by mouth daily.  . [DISCONTINUED] metFORMIN (GLUMETZA) 500 MG (MOD) 24 hr tablet Take 500 mg by mouth daily with breakfast. Reported on 05/05/2015  . [DISCONTINUED] sertraline (ZOLOFT) 100 MG tablet TAKE ONE & ONE-HALF TABLETS BY MOUTH EVERY DAY  . [DISCONTINUED] temazepam (RESTORIL) 15 MG capsule Take 1 capsule (15 mg total) by mouth at bedtime.   No facility-administered encounter medications on file as of 05/05/2015.    Past Psychiatric History/Hospitalization(s): Patient has history of psychiatric illness started in her teens.  She is at least 3 psychiatric hospitalization at [facility].  She is total 6 psychiatric hospitalization.  She was diagnosed with schizophrenia .  Most of the psychiatric admission was precipitated by paranoid thinking and noncompliance with the medication.  Patient denies any history of suicidal attempt.   Anxiety: No Bipolar Disorder: No Depression: Yes Mania: No Psychosis: Yes Schizophrenia: Yes Personality Disorder: No Hospitalization for psychiatric illness: Yes History of Electroconvulsive Shock Therapy: No Prior Suicide Attempts: No  Physical Exam: Constitutional:  BP 136/92 mmHg  Pulse 81  Ht 5\' 5"  (1.651 m)  Wt 273 lb 3.2 oz (123.923 kg)  BMI 45.46 kg/m2  General Appearance: well nourished and obese.  Cooperative and maintained fair eye contact.    Musculoskeletal: Strength & Muscle Tone: within normal limits Gait & Station: normal Patient leans: N/A  Mental Status Examination: Patient is casually dressed and groomed.  She is  overweight .  She maintained fair eye contact.  Her speech is slow but clear and coherent.  Her thought processes slow but logical and goal-directed.  She denies any auditory or visual hallucination.  She denies any active or passive suicidal thoughts or homicidal thought.  Her attention and consultation is fair.  There were no delusions, paranoia or any obsessive thoughts.  Her psychomotor activity is slow.  There were no tremors shakes or any EPS.  She's alert and oriented 3.  Her insight judgment and impulse control is okay.  Established Problem, Stable/Improving (1), Review or order clinical lab tests (1), Review of Last Therapy Session (1) and Review of New Medication or Change in Dosage (2)  Assessment: Axis I: Chronic Paranoid schizophrenia  Axis II: Deferred  Axis III: See medical history   Plan:  Review current medicationand blood work results.  In January she had hemoglobin A1c 7.1.  Her BUN/creatinine and liver function test were normal.  I will continue Abilify 10 mg daily, Zoloft 150 mg daily and temazepam 15 mg at bedtime.   patient has no tremors or shakes.  Patient will call us to inform her about blood pressure medication.  Recommended to call us back if she has any question or any concern.  We will follow-up on blood results from her primary care physician.  Follow-up in 6 months.  Wess Baney T., MD 05/05/2015

## 2015-11-04 ENCOUNTER — Other Ambulatory Visit (HOSPITAL_COMMUNITY): Payer: Self-pay

## 2015-11-04 DIAGNOSIS — F200 Paranoid schizophrenia: Secondary | ICD-10-CM

## 2015-11-04 MED ORDER — ARIPIPRAZOLE 10 MG PO TABS: 10.0000 mg | ORAL_TABLET | Freq: Every day | ORAL | 1 refills | Status: DC

## 2015-11-04 MED ORDER — SERTRALINE HCL 100 MG PO TABS: ORAL_TABLET | ORAL | 0 refills | Status: DC

## 2015-11-04 MED ORDER — TEMAZEPAM 15 MG PO CAPS: 15.0000 mg | ORAL_CAPSULE | Freq: Every day | ORAL | 1 refills | Status: DC

## 2015-11-04 NOTE — Telephone Encounter (Signed)
Medication refill request- Telephone message left this nurse received patient's Mother's call stating they needed refills for patient's medications since appt. canceled and rescheduled for 12/18/15.  Agreed to send to covering provider as patient was canceled from 11/05/15 with Dr. Lolly MustacheArfeen being out until 12/18/15.  Patient in need of refills for Restoril, Zoloft and Abilify.

## 2015-11-04 NOTE — Telephone Encounter (Signed)
Met with Dr. Lovena Le to discuss patient's request for refills of medications.  Dr. Lovena Le authorized refills of patient's Abilify and Restoril, #30 with one refill of each and a 90 day supply of patient's Sertraline.  Called in the order for Restoril to [facility] on [facility] with [place], pharmacist and e-scribed in the Abilify and Sertraline orders as instructed by Dr. Lovena Le.

## 2015-11-05 ENCOUNTER — Ambulatory Visit (HOSPITAL_COMMUNITY): Payer: Self-pay | Admitting: Psychiatry

## 2015-11-26 ENCOUNTER — Telehealth (HOSPITAL_COMMUNITY): Payer: Self-pay | Admitting: *Deleted

## 2015-11-26 NOTE — Telephone Encounter (Signed)
Medication refill- pt called to check to status of prior auth for Abilify. [facility] please review.

## 2015-12-18 ENCOUNTER — Encounter (HOSPITAL_COMMUNITY): Payer: Self-pay | Admitting: Psychiatry

## 2015-12-18 ENCOUNTER — Ambulatory Visit (INDEPENDENT_AMBULATORY_CARE_PROVIDER_SITE_OTHER): Payer: Medicaid Other | Admitting: Psychiatry

## 2015-12-18 DIAGNOSIS — F200 Paranoid schizophrenia: Secondary | ICD-10-CM | POA: Diagnosis not present

## 2015-12-18 MED ORDER — ARIPIPRAZOLE 10 MG PO TABS: 10.0000 mg | ORAL_TABLET | Freq: Every day | ORAL | 2 refills | Status: DC

## 2015-12-18 MED ORDER — TEMAZEPAM 15 MG PO CAPS
15.0000 mg | ORAL_CAPSULE | Freq: Every day | ORAL | 2 refills | Status: DC
Start: 2015-12-18 — End: 2016-02-17

## 2015-12-18 MED ORDER — SERTRALINE HCL 100 MG PO TABS: ORAL_TABLET | ORAL | 0 refills | Status: DC

## 2015-12-18 NOTE — Progress Notes (Signed)
[facility] [id number] Progress Note  Erika [name] [id number] 40 y.o.  12/18/2015 3:42 PM  Chief Complaint: Medication management and followup.  History of Present Illness: Erika [name] came for her followup appointment with her mother.  She had a good summer.  She went to [place] with her mother and had a good time.  She is taking her medication and reported no side effects.  She seen her primary care physician in August and she had blood work.  She had normal CBC and chemistry.  Her BUN is 9 and cracking 0.64.  Her hemoglobin A1c is 6.5 .  These labs are drawn on August 14.  She is taking Abilify, temazepam and Zoloft.  She has no tremors shakes or any EPS.  She denies any paranoia, hallucination, irritability or any crying spells.  Her energy level is good.  Patient denies drinking alcohol or using any illegal substances.  Her appetite is okay.  Her vital signs are stable.  Suicidal Ideation: No Plan Formed: No Patient has means to carry out plan: No  Homicidal Ideation: No Plan Formed: No Patient has means to carry out plan: No  ROS Psychiatric: Agitation: No Hallucination: No Depressed Mood: No Insomnia: No Hypersomnia: No Altered Concentration: No Feels Worthless: No Grandiose Ideas: No Belief In Special Powers: No New/Increased Substance Abuse: No Compulsions: No  Neurologic: Headache: No Seizure: No Paresthesias: No  Medical history Patient has obesity, GERD and new onset diabetes mellitus.  Her primary care physician is Dr. Rudean Hitt.   Social History: Patient lives with her mother.  Outpatient Encounter Prescriptions as of 12/18/2015  Medication Sig Dispense Refill  . ARIPiprazole (ABILIFY) 10 MG tablet Take 1 tablet (10 mg total) by mouth daily. 30 tablet 2  . Empagliflozin-Metformin HCl (SYNJARDY) 12.5-500 MG TABS Take by mouth 2 (two) times daily.    Marland Kitchen esomeprazole (NEXIUM) 20 MG capsule Take 20 mg by mouth daily before breakfast.      .  rosuvastatin (CRESTOR) 20 MG tablet Take 20 mg by mouth daily.      . sertraline (ZOLOFT) 100 MG tablet TAKE ONE & ONE-HALF TABLETS BY MOUTH EVERY DAY 105 tablet 0  . temazepam (RESTORIL) 15 MG capsule Take 1 capsule (15 mg total) by mouth at bedtime. 30 capsule 2  . [DISCONTINUED] ARIPiprazole (ABILIFY) 10 MG tablet Take 1 tablet (10 mg total) by mouth daily. 30 tablet 1  . [DISCONTINUED] sertraline (ZOLOFT) 100 MG tablet TAKE ONE & ONE-HALF TABLETS BY MOUTH EVERY DAY 105 tablet 0  . [DISCONTINUED] temazepam (RESTORIL) 15 MG capsule Take 1 capsule (15 mg total) by mouth at bedtime. 30 capsule 1   No facility-administered encounter medications on file as of 12/18/2015.     Past Psychiatric History/Hospitalization(s): Patient has history of psychiatric illness started in her teens.  She is at least 3 psychiatric hospitalization at [facility].  She is total 6 psychiatric hospitalization.  She was diagnosed with schizophrenia .  Most of the psychiatric admission was precipitated by paranoid thinking and noncompliance with the medication.  Patient denies any history of suicidal attempt.   Anxiety: No Bipolar Disorder: No Depression: Yes Mania: No Psychosis: Yes Schizophrenia: Yes Personality Disorder: No Hospitalization for psychiatric illness: Yes History of Electroconvulsive Shock Therapy: No Prior Suicide Attempts: No  Physical Exam: Constitutional:  BP 126/72   Pulse 82   Ht 5' 0.75" (1.543 m)   Wt 273 lb 12.8 oz (124.2 kg)   BMI 52.16 kg/m   General  Appearance: well nourished and obese.  Cooperative and maintained fair eye contact.    Musculoskeletal: Strength & Muscle Tone: within normal limits Gait & Station: normal Patient leans: N/A  Mental Status Examination: Patient is casually dressed and groomed.  She is cooperative .  She maintained fair eye contact.  Her speech is slow but clear and coherent.  Her thought processes slow but logical and  goal-directed.  She denies any auditory or visual hallucination.  She denies any active or passive suicidal thoughts or homicidal thought.  Her attention and consultation is fair.  There were no delusions, paranoia or any obsessive thoughts.  Her psychomotor activity is slow.  There were no tremors shakes or any EPS.  She's alert and oriented 3.  Her insight judgment and impulse control is okay.  Established Problem, Stable/Improving (1), Review or order clinical lab tests (1), Review of Last Therapy Session (1) and Review of New Medication or Change in Dosage (2)  Assessment: Axis I: Chronic Paranoid schizophrenia  Axis II: Deferred  Axis III: See medical history   Plan:  Review current medicationand blood work results.  Her hemoglobin A1c is 6.5 on August 14 which is actually dropped from the past.  Her paranoia and depression is controlled with current psychiatric medication.  I will continue Abilify 10 mg daily, Zoloft 150 mg daily and temazepam 15 mg at bedtime.   patient has no tremors or shakes.  Recommended to call us back if she has any question or any concern.  We will follow-up on blood results from her primary care physician.  Follow-up in 3 months.  Erika Bowler T., MD 12/18/2015               Patient ID: Erika [name], female   DOB: 05/29/1975, 40 y.o.   MRN: [id number]

## 2016-02-16 ENCOUNTER — Telehealth (HOSPITAL_COMMUNITY): Payer: Self-pay

## 2016-02-16 DIAGNOSIS — F200 Paranoid schizophrenia: Secondary | ICD-10-CM

## 2016-02-16 NOTE — Telephone Encounter (Signed)
Medication refill request - [facility] Restoril refill [facility].  Last order 12/18/15 + 2 refills.  Last filled 02/15/16 and patient does not return until 03/25/16.

## 2016-02-17 MED ORDER — TEMAZEPAM 15 MG PO CAPS: 15.0000 mg | ORAL_CAPSULE | Freq: Every day | ORAL | 0 refills | Status: DC

## 2016-02-17 NOTE — Addendum Note (Signed)
Addended by: Rosalita LevanATKINS, Jalon Blackwelder E on: 02/17/2016 01:36 PM   Modules accepted: Orders

## 2016-02-17 NOTE — Telephone Encounter (Signed)
Called in a one month order for Temazepam.

## 2016-02-17 NOTE — Telephone Encounter (Signed)
Okay to refill until her next appointment.

## 2016-03-25 ENCOUNTER — Ambulatory Visit (INDEPENDENT_AMBULATORY_CARE_PROVIDER_SITE_OTHER): Payer: Medicaid Other | Admitting: Psychiatry

## 2016-03-25 ENCOUNTER — Encounter (HOSPITAL_COMMUNITY): Payer: Self-pay | Admitting: Psychiatry

## 2016-03-25 DIAGNOSIS — Z888 Allergy status to other drugs, medicaments and biological substances status: Secondary | ICD-10-CM

## 2016-03-25 DIAGNOSIS — Z9049 Acquired absence of other specified parts of digestive tract: Secondary | ICD-10-CM | POA: Diagnosis not present

## 2016-03-25 DIAGNOSIS — F200 Paranoid schizophrenia: Secondary | ICD-10-CM

## 2016-03-25 DIAGNOSIS — Z79899 Other long term (current) drug therapy: Secondary | ICD-10-CM

## 2016-03-25 DIAGNOSIS — Z90710 Acquired absence of both cervix and uterus: Secondary | ICD-10-CM | POA: Diagnosis not present

## 2016-03-25 MED ORDER — TEMAZEPAM 15 MG PO CAPS: 15.0000 mg | ORAL_CAPSULE | Freq: Every day | ORAL | 5 refills | Status: DC

## 2016-03-25 MED ORDER — SERTRALINE HCL 100 MG PO TABS: ORAL_TABLET | ORAL | 5 refills | Status: DC

## 2016-03-25 MED ORDER — ARIPIPRAZOLE 10 MG PO TABS: 10.0000 mg | ORAL_TABLET | Freq: Every day | ORAL | 1 refills | Status: DC

## 2016-03-25 NOTE — Progress Notes (Signed)
BH MD/PA/NP OP Progress Note  03/25/2016 3:38 PM Erika [name]  MRN:  [id number]  Chief Complaint:  Subjective:  I'm doing good.  I had a good Christmas.  HPI: Patient came for her follow-up appointment with her mother.  She had a good Christmas.  She is taking her medication and reported no side effects.  She continued to enjoy part-time working and her mother endorsed that her supervisor like her a lot.  She sleeping good.  She denies any irritability, anger, mania, psychosis or any hallucination.  She denies any crying spells.  Her energy level is good.  She is more active and social.  She wants to continue temazepam, Abilify and Zoloft.  She has no tremors shakes or any EPS.  Her appetite is okay.  Energy level is good.  Patient denies drinking alcohol or using any illegal substances.  Visit Diagnosis:    ICD-9-CM ICD-10-CM   1. Paranoid schizophrenia (HCC) 295.30 F20.0 temazepam (RESTORIL) 15 MG capsule     sertraline (ZOLOFT) 100 MG tablet     ARIPiprazole (ABILIFY) 10 MG tablet    Past Psychiatric History: Reviewed. Patient has history of psychiatric illness started in her teens.  She is at least 3 psychiatric hospitalization at [facility].  She is total 6 psychiatric hospitalization.  She was diagnosed with schizophrenia .  Most of the psychiatric admission was precipitated by paranoid thinking and noncompliance with the medication.  Patient denies any history of suicidal attempt.    Past Medical History:  Past Medical History:  Diagnosis Date  . Diabetes (HCC)   . GERD (gastroesophageal reflux disease)   . Hyperlipemia   . Obesity     Past Surgical History:  Procedure Laterality Date  . ABDOMINAL HYSTERECTOMY    . CHOLECYSTECTOMY      Family Psychiatric History: Reviewed.  Family History: History reviewed. No pertinent family history.  Social History:  Social History   Social History  . Marital status: Single    Spouse name: N/A  . Number of  children: N/A  . Years of education: N/A   Social History Main Topics  . Smoking status: Never Smoker  . Smokeless tobacco: Never Used  . Alcohol use No  . Drug use: No  . Sexual activity: Not Asked   Other Topics Concern  . None   Social History Narrative  . None    Allergies:  Allergies  Allergen Reactions  . Haldol [Haloperidol Decanoate]     NMS     Metabolic Disorder Labs: No results found for: HGBA1C, MPG No results found for: PROLACTIN No results found for: CHOL, TRIG, HDL, CHOLHDL, VLDL, LDLCALC   Current Medications: Current Outpatient Prescriptions  Medication Sig Dispense Refill  . ARIPiprazole (ABILIFY) 10 MG tablet Take 1 tablet (10 mg total) by mouth daily. 30 tablet 2  . Empagliflozin-Metformin HCl (SYNJARDY) 12.5-500 MG TABS Take by mouth 2 (two) times daily.    Marland Kitchen esomeprazole (NEXIUM) 20 MG capsule Take 20 mg by mouth daily before breakfast.      . rosuvastatin (CRESTOR) 20 MG tablet Take 20 mg by mouth daily.      . sertraline (ZOLOFT) 100 MG tablet TAKE ONE & ONE-HALF TABLETS BY MOUTH EVERY DAY 105 tablet 0  . temazepam (RESTORIL) 15 MG capsule Take 1 capsule (15 mg total) by mouth at bedtime. 30 capsule 0   No current facility-administered medications for this visit.     Neurologic: Headache: No Seizure: No Paresthesias: No  Musculoskeletal: Strength & Muscle Tone: within normal limits Gait & Station: normal Patient leans: N/A  Psychiatric Specialty Exam: Review of Systems  Constitutional: Negative.   HENT: Negative.   Musculoskeletal: Negative.   Skin: Negative.   Neurological: Negative.     Blood pressure 126/76, pulse 79, height 5' 0.5" (1.537 m), weight 270 lb 12.8 oz (122.8 kg).Body mass index is 52.02 kg/m.  General Appearance: Casual  Eye Contact:  Fair  Speech:  Clear and Coherent  Volume:  Normal  Mood:  Euthymic  Affect:  Appropriate  Thought Process:  Goal Directed  Orientation:  Full (Time, Place, and Person)   Thought Content: WDL and Logical   Suicidal Thoughts:  No  Homicidal Thoughts:  No  Memory:  Immediate;   Fair Recent;   Fair Remote;   Fair  Judgement:  Fair  Insight:  Good  Psychomotor Activity:  Normal  Concentration:  Concentration: Fair and Attention Span: Fair  Recall:  [employer]Fair  Fund of Knowledge: Fair  Language: Good  Akathisia:  No  Handed:  Right  AIMS (if indicated):  0  Assets:  Desire for Improvement Housing Physical Health Resilience Social Support  ADL's:  Intact  Cognition: WNL  Sleep:  Good    Assessment: Schizophrenia chronic paranoid type.  Plan: Patient is a stable on her current psychiatric medication.  She has no concern or side effects.  I will continue Abilify 10 mg daily, Zoloft 150 mg daily and temazepam 15 mg at bedtime.  Discussed medication side effects and benefits.  Discuss benzodiazepine dependence tolerance and withdrawal.  Recommended to call us back if she has any question, concern or if she feels worsening of the symptom.  Follow-up in 6 months.  Erika [name] T., MD 03/25/2016, 3:38 PM

## 2016-06-18 ENCOUNTER — Other Ambulatory Visit: Payer: Self-pay | Admitting: Gastroenterology

## 2016-07-07 ENCOUNTER — Encounter (HOSPITAL_COMMUNITY)
Admission: RE | Disposition: A | Discharge status: Home / Self Care | Payer: Self-pay | Source: Ambulatory Visit | Attending: Gastroenterology

## 2016-07-07 ENCOUNTER — Ambulatory Visit (HOSPITAL_COMMUNITY)
Admission: RE | Admit: 2016-07-07 | Discharge: 2016-07-07 | Disposition: A | Discharge status: Home / Self Care | Payer: Medicaid Other | Source: Ambulatory Visit | Attending: Gastroenterology | Admitting: Gastroenterology

## 2016-07-07 ENCOUNTER — Encounter (HOSPITAL_COMMUNITY): Payer: Self-pay | Admitting: *Deleted

## 2016-07-07 ENCOUNTER — Ambulatory Visit (HOSPITAL_COMMUNITY): Payer: Medicaid Other | Admitting: Anesthesiology

## 2016-07-07 DIAGNOSIS — E119 Type 2 diabetes mellitus without complications: Secondary | ICD-10-CM | POA: Diagnosis not present

## 2016-07-07 DIAGNOSIS — K2950 Unspecified chronic gastritis without bleeding: Secondary | ICD-10-CM | POA: Diagnosis not present

## 2016-07-07 DIAGNOSIS — K449 Diaphragmatic hernia without obstruction or gangrene: Secondary | ICD-10-CM | POA: Diagnosis not present

## 2016-07-07 DIAGNOSIS — E785 Hyperlipidemia, unspecified: Secondary | ICD-10-CM | POA: Diagnosis not present

## 2016-07-07 DIAGNOSIS — K920 Hematemesis: Secondary | ICD-10-CM | POA: Insufficient documentation

## 2016-07-07 DIAGNOSIS — Z79899 Other long term (current) drug therapy: Secondary | ICD-10-CM | POA: Insufficient documentation

## 2016-07-07 DIAGNOSIS — F209 Schizophrenia, unspecified: Secondary | ICD-10-CM | POA: Insufficient documentation

## 2016-07-07 DIAGNOSIS — Z7984 Long term (current) use of oral hypoglycemic drugs: Secondary | ICD-10-CM | POA: Insufficient documentation

## 2016-07-07 DIAGNOSIS — K219 Gastro-esophageal reflux disease without esophagitis: Secondary | ICD-10-CM | POA: Insufficient documentation

## 2016-07-07 HISTORY — PX: ESOPHAGOGASTRODUODENOSCOPY (EGD) WITH PROPOFOL: SHX5813

## 2016-07-07 LAB — GLUCOSE, CAPILLARY: GLUCOSE-CAPILLARY: 102 mg/dL — AB (ref 65–99)

## 2016-07-07 SURGERY — ESOPHAGOGASTRODUODENOSCOPY (EGD) WITH PROPOFOL
Anesthesia: Monitor Anesthesia Care

## 2016-07-07 MED ORDER — PROPOFOL 500 MG/50ML IV EMUL
INTRAVENOUS | Status: DC | PRN
  Administered 2016-07-07: 140 ug/kg/min via INTRAVENOUS

## 2016-07-07 MED ORDER — SODIUM CHLORIDE 0.9 % IV SOLN: INTRAVENOUS | Status: DC

## 2016-07-07 MED ORDER — PROPOFOL 10 MG/ML IV BOLUS
INTRAVENOUS | Status: AC
  Filled 2016-07-07: qty 20

## 2016-07-07 MED ORDER — PROPOFOL 10 MG/ML IV BOLUS
INTRAVENOUS | Status: DC | PRN
  Administered 2016-07-07 (×2): 20 mg via INTRAVENOUS

## 2016-07-07 MED ORDER — LACTATED RINGERS IV SOLN
INTRAVENOUS | Status: DC
Start: 2016-07-07 — End: 2016-07-07
  Administered 2016-07-07: 1000 mL via INTRAVENOUS

## 2016-07-07 MED ORDER — LIDOCAINE 2% (20 MG/ML) 5 ML SYRINGE
INTRAMUSCULAR | Status: AC
  Filled 2016-07-07: qty 5

## 2016-07-07 MED ORDER — LIDOCAINE 2% (20 MG/ML) 5 ML SYRINGE
INTRAMUSCULAR | Status: DC | PRN
  Administered 2016-07-07: 100 mg via INTRAVENOUS

## 2016-07-07 SURGICAL SUPPLY — 14 items

## 2016-07-07 NOTE — Anesthesia Postprocedure Evaluation (Signed)
Anesthesia Post Note  Patient: Erika [name]  Procedure(s) Performed: Procedure(s) (LRB): ESOPHAGOGASTRODUODENOSCOPY (EGD) WITH PROPOFOL (N/A)  Patient location during evaluation: PACU Anesthesia Type: MAC Level of consciousness: awake and alert Pain management: pain level controlled Vital Signs Assessment: post-procedure vital signs reviewed and stable Respiratory status: spontaneous breathing, nonlabored ventilation and respiratory function stable Cardiovascular status: stable and blood pressure returned to baseline Anesthetic complications: no       Last Vitals:  Vitals:   07/07/16 1309 07/07/16 1440  BP: (!) 112/45 (!) 113/29  Pulse: 68 83  Resp: (!) 21 (!) 28  Temp: 36.7 C 36.7 C    Last Pain:  Vitals:   07/07/16 1440  TempSrc: Oral                 Lynda Rainwater

## 2016-07-07 NOTE — Op Note (Signed)
[facility] Patient Name: Erika [name] Procedure Date: 07/07/2016 MRN: [id number] Attending MD: Nancy Fetter Dr., MD Date of Birth: [birth year] CSN: [id number] Age: 41 Admit Type: Outpatient Procedure:                Upper GI endoscopy Indications:              Esophageal reflux, For therapy of esophageal                            reflux, Hematemesis Providers:                Jeneen Rinks L. Padraig Nhan Dr., MD, Burtis Junes, RN, Lillie Fragmin, RN, Cherylynn Ridges, Technician, Alan Mulder, Technician, Danley Danker, CRNA Referring MD:             Dr Abbott Pao Medicines:                Monitored Anesthesia Care Complications:            No immediate complications. Estimated Blood Loss:     Estimated blood loss: none. Procedure:                Pre-Anesthesia Assessment:                           - Prior to the procedure, a History and Physical                            was performed, and patient medications and                            allergies were reviewed. The patient's tolerance of                            previous anesthesia was also reviewed. The risks                            and benefits of the procedure and the sedation                            options and risks were discussed with the patient.                            All questions were answered, and informed consent                            was obtained. Prior Anticoagulants: The patient has                            taken no previous anticoagulant or antiplatelet  agents. ASA Grade Assessment: III - A patient with                            severe systemic disease. After reviewing the risks                            and benefits, the patient was deemed in                            satisfactory condition to undergo the procedure.                           After obtaining informed consent, the endoscope was            passed under direct vision. Throughout the                            procedure, the patient's blood pressure, pulse, and                            oxygen saturations were monitored continuously. The                            [id number] ([id number]) scope was introduced through the                            mouth, and advanced to the second part of duodenum.                            The upper GI endoscopy was accomplished without                            difficulty. The patient tolerated the procedure                            well. Scope In: Scope Out: Findings:      There is no endoscopic evidence of Barrett's esophagus, esophagitis,       inflammation, mucosal abnormalities, ulcerations or varices in the       entire esophagus.      A small hiatal hernia was present. Diaphragm located 37 cm from the teeth      Localized moderate inflammation characterized by erosions was found in       the gastric antrum. Biopsies were taken with a cold forceps for       histology.      The examined duodenum was normal. Impression:               - Small hiatal hernia.                           - Gastritis. Biopsied.                           - Normal examined duodenum.                           -  GERD. Moderate Sedation:      MAC by anesthesia Recommendation:           - Patient has a contact number available for                            emergencies. The signs and symptoms of potential                            delayed complications were discussed with the                            patient. Return to normal activities tomorrow.                            Written discharge instructions were provided to the                            patient.                           - Resume previous diet.                           - Continue present medications.                           - Return to endoscopist in 6 weeks. Procedure Code(s):        --- Professional ---                            [phone], Esophagogastroduodenoscopy, flexible,                            transoral; with biopsy, single or multiple Diagnosis Code(s):        --- Professional ---                           K29.70, Gastritis, unspecified, without bleeding                           K21.9, Gastro-esophageal reflux disease without                            esophagitis                           K92.0, Hematemesis                           K44.9, Diaphragmatic hernia without obstruction or                            gangrene CPT copyright 2016 American Medical Association. All rights reserved. The codes documented in this report are preliminary and upon coder review may  be revised to meet current compliance requirements. Nancy Fetter Dr., MD 07/07/2016 2:40:51 PM This report has been signed electronically. Number of Addenda: 0

## 2016-07-07 NOTE — H&P (Signed)
Subjective:   Patient is a 41 y.o. female presents with A history of chronic esophageal reflux. She has been on a number of medications but to concerns about long-term use of PPI's she has been on ranitidine 300 mg BID. She had one episode in nausea and small amount hematemesis. It's been several years since she has had an EGD and for this reason EGD is performed. The procedure has been discussed in detail in the office.. Procedure including risks and benefits discussed in office.  Patient Active Problem List   Diagnosis Date Noted  . Morbid obesity (HCC) 07/03/2012  . Diabetes (HCC) 02/02/2012  . GERD (gastroesophageal reflux disease) 02/02/2012  . Psychosis 05/24/2011   Past Medical History:  Diagnosis Date  . Diabetes (HCC)   . GERD (gastroesophageal reflux disease)   . Hyperlipemia   . Obesity     Past Surgical History:  Procedure Laterality Date  . ABDOMINAL HYSTERECTOMY    . CHOLECYSTECTOMY      Prescriptions Prior to Admission  Medication Sig Dispense Refill Last Dose  . ARIPiprazole (ABILIFY) 10 MG tablet Take 1 tablet (10 mg total) by mouth daily. 90 tablet 1 07/06/2016 at Unknown time  . atorvastatin (LIPITOR) 20 MG tablet Take 20 mg by mouth at bedtime.   07/06/2016 at Unknown time  . benazepril (LOTENSIN) 10 MG tablet Take 10 mg by mouth daily.   07/06/2016 at Unknown time  . cholecalciferol (VITAMIN D) 1000 units tablet Take 1,000 Units by mouth daily.   07/06/2016 at Unknown time  . Empagliflozin-Metformin HCl ER (SYNJARDY XR) 11-998 MG TB24 Take 0.5 tablets by mouth at bedtime.   07/06/2016 at Unknown time  . Papaya Enzyme CHEW Chew 1 each by mouth 3 (three) times daily after meals.   07/06/2016 at Unknown time  . ranitidine (ZANTAC) 300 MG capsule Take 300 mg by mouth 2 (two) times daily.   07/06/2016 at Unknown time  . sertraline (ZOLOFT) 100 MG tablet TAKE ONE & ONE-HALF TABLETS BY MOUTH EVERY DAY (Patient taking differently: Take 150 mg by mouth daily. ) 105 tablet 5 07/06/2016  at Unknown time  . temazepam (RESTORIL) 15 MG capsule Take 1 capsule (15 mg total) by mouth at bedtime. 30 capsule 5 07/06/2016 at Unknown time   Allergies  Allergen Reactions  . 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 Smoking status: Never Smoker  . Smokeless tobacco: Never Used  . Alcohol use No    History reviewed. No pertinent family history.   Objective:   Patient Vitals for the past 8 hrs:  BP Temp Temp src Pulse Resp SpO2 Height Weight  07/07/16 1309 (!) 112/45 98.1 F (36.7 C) Oral 68 (!) 21 95 % - -  07/07/16 1307 - - - - - - 5' (1.524 m) 122.5 kg (270 lb)   No intake/output data recorded. No intake/output data recorded.   See MD Preop evaluation      Assessment:   1. Hematemesis and woman with chronic esophageal reflux. Procedures don't rule out a gross lesion of the esophagus or upper G.I. tract.  Plan:   We will proceed with EGD at this time. This is been discussed in detail with the patient.

## 2016-07-07 NOTE — Transfer of Care (Signed)
Immediate Anesthesia Transfer of Care Note  Patient: Erika [name]  Procedure(s) Performed: Procedure(s): ESOPHAGOGASTRODUODENOSCOPY (EGD) WITH PROPOFOL (N/A)  Patient Location: Endoscopy Unit  Anesthesia Type:MAC  Level of Consciousness: awake, alert  and oriented  Airway & Oxygen Therapy: Patient Spontanous Breathing and Patient connected to nasal cannula oxygen  Post-op Assessment: Report given to RN and Post -op Vital signs reviewed and stable  Post vital signs: Reviewed and stable  Last Vitals:  Vitals:   07/07/16 1309  BP: (!) 112/45  Pulse: 68  Resp: (!) 21  Temp: 36.7 C    Last Pain:  Vitals:   07/07/16 1309  TempSrc: Oral         Complications: No apparent anesthesia complications

## 2016-07-07 NOTE — Anesthesia Preprocedure Evaluation (Signed)
Anesthesia Evaluation  Patient identified by MRN, date of birth, ID band Patient awake    Reviewed: Allergy & Precautions, NPO status , Patient's Chart, lab work & pertinent test results  Airway Mallampati: III  TM Distance: >3 FB Neck ROM: Full    Dental no notable dental hx.    Pulmonary neg pulmonary ROS,    Pulmonary exam normal breath sounds clear to auscultation       Cardiovascular negative cardio ROS Normal cardiovascular exam Rhythm:Regular Rate:Normal     Neuro/Psych Schizophrenia negative neurological ROS  negative psych ROS   GI/Hepatic negative GI ROS, Neg liver ROS, GERD  ,  Endo/Other  negative endocrine ROSdiabetes  Renal/GU negative Renal ROS  negative genitourinary   Musculoskeletal negative musculoskeletal ROS (+)   Abdominal   Peds negative pediatric ROS (+)  Hematology negative hematology ROS (+)   Anesthesia Other Findings   Reproductive/Obstetrics negative OB ROS                             Anesthesia Physical Anesthesia Plan  ASA: III  Anesthesia Plan: MAC   Post-op Pain Management:    Induction: Intravenous  Airway Management Planned:   Additional Equipment:   Intra-op Plan:   Post-operative Plan:   Informed Consent: I have reviewed the patients History and Physical, chart, labs and discussed the procedure including the risks, benefits and alternatives for the proposed anesthesia with the patient or authorized representative who has indicated his/her understanding and acceptance.   Dental advisory given  Plan Discussed with: CRNA  Anesthesia Plan Comments:         Anesthesia Quick Evaluation

## 2016-07-07 NOTE — Discharge Instructions (Signed)

## 2016-07-08 ENCOUNTER — Encounter (HOSPITAL_COMMUNITY): Payer: Self-pay | Admitting: Gastroenterology

## 2016-08-18 ENCOUNTER — Other Ambulatory Visit: Payer: Self-pay | Admitting: Gastroenterology

## 2016-08-18 DIAGNOSIS — R1032 Left lower quadrant pain: Secondary | ICD-10-CM

## 2016-08-23 ENCOUNTER — Ambulatory Visit
Admission: RE | Admit: 2016-08-23 | Discharge: 2016-08-23 | Disposition: A | Discharge status: Home / Self Care | Payer: Medicaid Other | Source: Ambulatory Visit | Attending: Gastroenterology | Admitting: Gastroenterology

## 2016-08-23 DIAGNOSIS — R1032 Left lower quadrant pain: Secondary | ICD-10-CM

## 2016-08-23 MED ORDER — IOPAMIDOL (ISOVUE-300) INJECTION 61%
100.0000 mL | Freq: Once | INTRAVENOUS | Status: AC | PRN
  Administered 2016-08-23: 100 mL via INTRAVENOUS

## 2016-09-23 ENCOUNTER — Ambulatory Visit (INDEPENDENT_AMBULATORY_CARE_PROVIDER_SITE_OTHER): Payer: Medicaid Other | Admitting: Psychiatry

## 2016-09-23 ENCOUNTER — Encounter (HOSPITAL_COMMUNITY): Payer: Self-pay | Admitting: Psychiatry

## 2016-09-23 DIAGNOSIS — F200 Paranoid schizophrenia: Secondary | ICD-10-CM

## 2016-09-23 DIAGNOSIS — R12 Heartburn: Secondary | ICD-10-CM

## 2016-09-23 MED ORDER — ARIPIPRAZOLE 10 MG PO TABS: 10.0000 mg | ORAL_TABLET | Freq: Every day | ORAL | 1 refills | Status: DC

## 2016-09-23 MED ORDER — SERTRALINE HCL 100 MG PO TABS: 150.0000 mg | ORAL_TABLET | Freq: Every day | ORAL | 1 refills | Status: DC

## 2016-09-23 MED ORDER — TEMAZEPAM 15 MG PO CAPS: 15.0000 mg | ORAL_CAPSULE | Freq: Every day | ORAL | 1 refills | Status: DC

## 2016-09-23 NOTE — Progress Notes (Signed)
BH MD/PA/NP OP Progress Note  09/23/2016 4:02 PM Erika [name]  MRN:  [id number]  Chief Complaint:  Chief Complaint    Follow-up     Subjective:  I am doing good.  I'm working full-time and I really like my job.  HPI: Patient came for her follow-up appointment with her father.  She is taking the medication and denies any side effects.  She sleeping good.  She quit her part-time job and now working full-time at [employer] and she really like her job.  She denies any irritability, anger, mania, psychosis or any hallucination.  Recently she's seen her primary care physician Dr. Ludwig [name] for checkup and she was told that she will see a cardiologist because her EKG was not normal.  She was not sure what is the abnormality on EKG.  Patient denies any chest pain, shortness of breath, palpitation but sometime having headaches.  She has acid reflux and recently she's seen GI and have images of the abdominal.  She has fatty liver but there are no new medication added.  Patient denies drinking alcohol or using any illegal substances.  Her energy level is good.  She has no tremors shakes or any EPS.  She does not want to change medication.  Visit Diagnosis:    ICD-10-CM   1. Paranoid schizophrenia (HCC) F20.0 temazepam (RESTORIL) 15 MG capsule    sertraline (ZOLOFT) 100 MG tablet    ARIPiprazole (ABILIFY) 10 MG tablet    Past Psychiatric History: Reviewed. Patient has history of psychiatric illness started in her teens. She is at least 3 [facility]. She is total 6 psychiatric hospitalization. She was diagnosed with schizophrenia . Most of the psychiatric admission was precipitated by paranoid thinking and noncompliance with the medication. Patient denies any history of suicidal attempt.   Past Medical History:  Past Medical History:  Diagnosis Date  . Diabetes (HCC)   . GERD (gastroesophageal reflux disease)   . Hyperlipemia   . Obesity      Past Surgical History:  Procedure Laterality Date  . ABDOMINAL HYSTERECTOMY    . CHOLECYSTECTOMY    . ESOPHAGOGASTRODUODENOSCOPY (EGD) WITH PROPOFOL N/A 07/07/2016   Procedure: ESOPHAGOGASTRODUODENOSCOPY (EGD) WITH PROPOFOL;  Surgeon: Erika [name], James, MD;  Location: [facility];  Service: Endoscopy;  Laterality: N/A;    Family Psychiatric History: Reviewed.  Family History: History reviewed. No pertinent family history.  Social History:  Social History   Social History  . Marital status: Single    Spouse name: N/A  . Number of children: N/A  . Years of education: N/A   Social History Main Topics  . Smoking status: Never Smoker  . Smokeless tobacco: Never Used  . Alcohol use No  . Drug use: No  . Sexual activity: Not Currently    Birth control/ protection: Surgical   Other Topics Concern  . None   Social History Narrative  . None    Allergies:  Allergies  Allergen Reactions  . 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 ARIPiprazole (ABILIFY) 10 MG tablet Take 1 tablet (10 mg total) by mouth daily. 90 tablet 1  . atorvastatin (LIPITOR) 20 MG tablet Take 20  mg by mouth at bedtime.    . benazepril (LOTENSIN) 10 MG tablet Take 10 mg by mouth daily.    . cholecalciferol (VITAMIN D) 1000 units tablet Take 1,000 Units by mouth daily.    . Empagliflozin-Metformin HCl ER (SYNJARDY XR) 11-998 MG TB24 Take 0.5 tablets by mouth at bedtime.    . Papaya Enzyme CHEW Chew 1 each by mouth 3 (three) times daily after meals.    . ranitidine (ZANTAC) 300 MG capsule Take 300 mg by mouth 2 (two) times daily.    . sertraline (ZOLOFT) 100 MG tablet TAKE ONE & ONE-HALF  TABLETS BY MOUTH EVERY DAY (Patient taking differently: Take 150 mg by mouth daily. ) 105 tablet 5  . temazepam (RESTORIL) 15 MG capsule Take 1 capsule (15 mg total) by mouth at bedtime. 30 capsule 5   No current facility-administered medications for this visit.     Neurologic: Headache: Yes Seizure: No Paresthesias: No  Musculoskeletal: Strength & Muscle Tone: within normal limits Gait & Station: normal Patient leans: N/A  Psychiatric Specialty Exam: Review of Systems  Gastrointestinal: Positive for heartburn.    Blood pressure 128/70, pulse 77, height 5' 0.75" (1.543 m), weight 264 lb (119.7 kg).Body mass index is 50.29 kg/m.  General Appearance: Casual  Eye Contact:  Fair  Speech:  Slow  Volume:  Normal  Mood:  Euthymic  Affect:  Congruent  Thought Process:  Goal Directed  Orientation:  Full (Time, Place, and Person)  Thought Content: Logical   Suicidal Thoughts:  No  Homicidal Thoughts:  No  Memory:  Immediate;   Good Recent;   Good Remote;   Good  Judgement:  Good  Insight:  Good  Psychomotor Activity:  Normal  Concentration:  Concentration: Fair and Attention Span: Fair  Recall:  Fair  Fund of Knowledge: Good  Language: Good  Akathisia:  No  Handed:  Right  AIMS (if indicated):  0  Assets:  Communication Skills Desire for Improvement Housing Resilience Social Support Talents/Skills  ADL's:  Intact  Cognition: WNL  Sleep:  good   Assessment: Schizophrenia chronic paranoid type.  Plan: Patient is doing better on her current psychiatric medication.  Recently she's seen GI for acid reflux and also scheduled to see a cardiologist for abnormal EKG.  Patient does not have any chest pain or shortness of breath but occasionally headaches.  She does not want to change medication.  I recommended to have her blood work results faxed to us.  I will continue Zoloft) 50 mg daily, Abilify 10 mg daily and temazepam 15 mg at bedtime.  Discussed medication side effects  and benefits.  Recommended to call us back if she has any question or any concern.  Follow-up in 6 months.   Erika [name] T., MD 09/23/2016, 4:02 PM

## 2016-11-15 ENCOUNTER — Encounter: Payer: Self-pay | Admitting: Neurology

## 2016-11-17 ENCOUNTER — Encounter: Payer: Self-pay | Admitting: Neurology

## 2016-11-17 ENCOUNTER — Ambulatory Visit (INDEPENDENT_AMBULATORY_CARE_PROVIDER_SITE_OTHER): Payer: Medicaid Other | Admitting: Neurology

## 2016-11-17 VITALS — BP 134/85 | HR 80 | Ht 63.0 in | Wt 261.0 lb

## 2016-11-17 DIAGNOSIS — G4710 Hypersomnia, unspecified: Secondary | ICD-10-CM

## 2016-11-17 DIAGNOSIS — E118 Type 2 diabetes mellitus with unspecified complications: Secondary | ICD-10-CM | POA: Diagnosis not present

## 2016-11-17 DIAGNOSIS — M2619 Other specified anomalies of jaw-cranial base relationship: Secondary | ICD-10-CM

## 2016-11-17 DIAGNOSIS — F70 Mild intellectual disabilities: Secondary | ICD-10-CM

## 2016-11-17 DIAGNOSIS — G4730 Sleep apnea, unspecified: Secondary | ICD-10-CM | POA: Diagnosis not present

## 2016-11-17 DIAGNOSIS — R519 Headache, unspecified: Secondary | ICD-10-CM

## 2016-11-17 DIAGNOSIS — R51 Headache: Secondary | ICD-10-CM

## 2016-11-17 DIAGNOSIS — K219 Gastro-esophageal reflux disease without esophagitis: Secondary | ICD-10-CM

## 2016-11-17 DIAGNOSIS — R0683 Snoring: Secondary | ICD-10-CM | POA: Diagnosis not present

## 2016-11-17 NOTE — Progress Notes (Signed)
SLEEP MEDICINE CLINIC   Provider:  Melvyn Novas, M D  Primary Care Physician:  Ralene Ok, MD   Referring Provider: [facility] - Dr. Rosemary Holms   Chief Complaint  Patient presents with  . New Patient (Initial Visit)    PT here with mom, pt says she snores and has stopped breathing in sleep. pt intermittently has some sleepiness in daytime.    HPI:  Erika [name] is a 41 y.o. female , seen here as in a referral/ revisit  from Dr. Verl Dicker Partner , Dr. Rosemary Holms.   She is according to her mother's observation snoring very loudly, for many years. She gained weight over the last 5 years, partially attributed to depression. The patient has a long psychiatric history, has a learning disability( Dr. Lolly Mustache)  She Gained weight over the last years due to psychotropic medication for the treatment of depression, she also has developed hypertension, and she was witnessed to have apnea after a medical procedure, but she cannot remember what kind of procedures this was.   Chief complaint according to patient : " I don't know that I snore"   Sleep habits are as follows: Erika [name] usually watches TV for the last hours of the day before she goes to bed. Her bedtime is around 10 PM, and she retreats to her bedroom at this time. Her bedroom is cool quiet and dark. She sleeps on her side. She avoids the right side because of an ophthalmological condition. She wears a mask for eye protection to sleep. She wakes up frequently to go to the bathroom at night at least 2 times. She rises in the morning and 5:15. Usually she has got 6 hours of sleep by that time. She feels usually rested and restored in the morning but also reports a dry mouth. She likes to chew gum to give her relief from the dry mouth. She also drinks a lot of water during the day, but has to be reminded.   Sleep medical history and family sleep history: There is no other known family history of sleep apnea, but Ms.  Kinoshita has been observed reading irregular in her sleep. She has diabetes, HTN, super obesity. Abnormal EKG- "flutter " .  She is on a sleep walker, has no history of night terrors or enuresis. She never underwent tonsillectomy and has no history of neck surgery or trauma.   Social history: Erika [name] lives with her parents, she is usually the first one in bed and rises without resistance and the morning. She goes to an adult enrichment center, and enjoys it. She does not use tobacco in any form, she does not drink alcohol, caffeine use she does drink coffee, soda and ice tea. She does not drink more than 1 or 2 cups of caffeinated beverage a day. Mostly soda.  Review of Systems: Out of a complete 14 system review, the patient complains of only the following symptoms, and all other reviewed systems are negative.  Epworth score  5 , Fatigue severity score 18 , depression score n/a    Social History   Social History  . Marital status: Single    Spouse name: N/A  . Number of children: N/A  . Years of education: N/A   Occupational History  . Not on file.   Social History Main Topics  . Smoking status: Never Smoker  . Smokeless tobacco: Never Used  . Alcohol use No  . Drug use: No  . Sexual activity: Not Currently  Birth control/ protection: Surgical   Other Topics Concern  . Not on file   Social History Narrative  . No narrative on file    Family History  Problem Relation Age of Onset  . Hypertension Mother   . Hyperlipidemia Mother   . Diabetes Mother   . Diabetes Father   . Hypertension Father     Past Medical History:  Diagnosis Date  . Anxiety   . Depression   . Diabetes (HCC)   . GERD (gastroesophageal reflux disease)   . GERD (gastroesophageal reflux disease)   . Hyperlipemia   . Hyperlipidemia   . Obesity     Past Surgical History:  Procedure Laterality Date  . ABDOMINAL HYSTERECTOMY    . CHOLECYSTECTOMY    . ESOPHAGOGASTRODUODENOSCOPY (EGD) WITH  PROPOFOL N/A 07/07/2016   Procedure: ESOPHAGOGASTRODUODENOSCOPY (EGD) WITH PROPOFOL;  Surgeon: Carman Ching, MD;  Location: [facility];  Service: Endoscopy;  Laterality: N/A;    Current Outpatient Prescriptions  Medication Sig Dispense Refill  . ARIPiprazole (ABILIFY) 10 MG tablet Take 1 tablet (10 mg total) by mouth daily. 90 tablet 1  . atorvastatin (LIPITOR) 20 MG tablet Take 20 mg by mouth at bedtime.    . benazepril (LOTENSIN) 10 MG tablet Take 10 mg by mouth daily.    . cholecalciferol (VITAMIN D) 1000 units tablet Take 1,000 Units by mouth daily.    . Empagliflozin-Metformin HCl ER (SYNJARDY XR) 11-998 MG TB24 Take 0.5 tablets by mouth at bedtime.    . Papaya Enzyme CHEW Chew 1 each by mouth 3 (three) times daily after meals.    . ranitidine (ZANTAC) 300 MG capsule Take 300 mg by mouth 2 (two) times daily.    . sertraline (ZOLOFT) 100 MG tablet Take 1.5 tablets (150 mg total) by mouth daily. 135 tablet 1  . temazepam (RESTORIL) 15 MG capsule Take 1 capsule (15 mg total) by mouth at bedtime. 90 capsule 1   No current facility-administered medications for this visit.     Allergies as of 11/17/2016 - Review Complete 11/17/2016  Allergen Reaction Noted  . Haldol [haloperidol decanoate] Other (See Comments) 02/03/2011    Vitals: BP 134/85   Pulse 80   Ht  (1.6 m)   Wt 261 lb (118.4 kg)   BMI 46.23 kg/m  Last Weight:  Wt Readings from Last 1 Encounters:  11/17/16 261 lb (118.4 kg)   ZOX:WRUE mass index is 46.23 kg/m.     Last Height:   Ht Readings from Last 1 Encounters:  11/17/16  (1.6 m)    Physical exam:  General: The patient is awake, alert and appears not in acute distress. The patient is well groomed. Head: Normocephalic, atraumatic. Neck is supple. Mallampati 5 ,  neck circumference:18". Nasal airflow restrcicted, TMJ is not  evident . 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  Cardiovascular:  Regular rate and rhythm , without  murmurs or carotid  bruit, and without distended neck veins. Respiratory: Lungs are clear to auscultation. Skin:  Without evidence of edema, or rash Trunk: BMI is 46. The patient's posture is stooped.   Neurologic exam : The patient is awake and alert, oriented to place and time.   Memory subjective described as intact.  Attention span & concentration ability appears normal.  Speech is fluent,  without dysarthria, dysphonia or aphasia.  Mood and affect are appropriate.  Cranial nerves: Pupils are equal and briskly reactive to light. Funduscopic exam without evidence of pallor or edema.  Extraocular movements  in vertical and horizontal planes intact and without nystagmus. Visual fields by finger perimetry are intact. Hearing to finger rub intact.  Facial sensation intact to fine touch.  Facial motor strength is symmetric and tongue and uvula move midline. Shoulder shrug was symmetrical.   Motor exam:  Normal tone, muscle bulk and symmetric strength in all extremities. Sensory:  Fine touch, pinprick and vibration were normal. Coordination: Finger-to-nose maneuver  normal without evidence of ataxia, dysmetria or tremor. Gait and station: Patient walks without assistive device and is able unassisted to climb up to the exam table. Strength within normal limits.Stance is stable and normal.   Turns with 4 Steps. Deep tendon reflexes: in the  upper and lower extremities are symmetric and intact. Babinski maneuver response is  downgoing.    Assessment:  After physical and neurologic examination, review of laboratory studies,  Personal review of imaging studies, reports of other /same  Imaging studies, results of polysomnography and / or neurophysiology testing and pre-existing records as far as provided in visit., my assessment is   1) obstructive sleep apnea-I am benefiting from Ms. Melroy's mother's observation that her daughter is snoring and has apnea as well as from the reported postoperative or post  procedural apnea witnessed several years ago. Mrs. Shek's weight gain seem to be correlated to the use of psychiatric medications and as these have over the last year been tapered she was also able to lose weight. She still is considered morbidly obese. She also has a higher than average neck circumference, but most telling a very high-grade Mallampati and retrognathia making her prone to have apnea independent of weight.  The patient was advised of the nature of the diagnosed disorder , the treatment options and the  risks for general health and wellness arising from not treating the condition.   I spent more than 45  minutes of face to face time with the patient and her mother . I have reviewed the records of [facility].   Greater than 50% of time was spent in counseling and coordination of care. We have discussed the diagnosis and differential and I answered the patient's questions.    Plan:  Treatment plan and additional workup : I would like to order a overnight split-night polysomnography for Mrs. Hefner, this would entail 2 hours of sleep to make a diagnosis, and if a severe form of apnea is found we would treat her as a same night. At this time she may be a candidate for CPAP or a dental device depending on how much oxygen desaturation is associated with apnea. Snoring itself does not mean that the patient has to have apnea but that there is upper airway resistance the. It is usually doing REM sleep but shallow breathing becomes full apnea. I would benefit from seeing those sleep architecture marks in an attended sleep study, while I cannot differentiate these on a home sleep test.   Melvyn Novas, MD 11/17/2016, 8:08 AM  Certified in Neurology by ABPN Certified in Sleep Medicine by [facility] [address] [place], [place] [address]

## 2016-12-24 ENCOUNTER — Ambulatory Visit (INDEPENDENT_AMBULATORY_CARE_PROVIDER_SITE_OTHER): Payer: Medicaid Other | Admitting: Neurology

## 2016-12-24 DIAGNOSIS — G4710 Hypersomnia, unspecified: Secondary | ICD-10-CM

## 2016-12-24 DIAGNOSIS — F70 Mild intellectual disabilities: Secondary | ICD-10-CM

## 2016-12-24 DIAGNOSIS — K219 Gastro-esophageal reflux disease without esophagitis: Secondary | ICD-10-CM

## 2016-12-24 DIAGNOSIS — M2619 Other specified anomalies of jaw-cranial base relationship: Secondary | ICD-10-CM

## 2016-12-24 DIAGNOSIS — G4733 Obstructive sleep apnea (adult) (pediatric): Secondary | ICD-10-CM

## 2016-12-24 DIAGNOSIS — R51 Headache: Secondary | ICD-10-CM

## 2016-12-24 DIAGNOSIS — G4730 Sleep apnea, unspecified: Secondary | ICD-10-CM

## 2016-12-24 DIAGNOSIS — R0683 Snoring: Secondary | ICD-10-CM

## 2016-12-24 DIAGNOSIS — E118 Type 2 diabetes mellitus with unspecified complications: Secondary | ICD-10-CM

## 2016-12-24 DIAGNOSIS — R519 Headache, unspecified: Secondary | ICD-10-CM

## 2016-12-29 ENCOUNTER — Telehealth: Payer: Self-pay | Admitting: Neurology

## 2016-12-29 DIAGNOSIS — R0683 Snoring: Secondary | ICD-10-CM | POA: Insufficient documentation

## 2016-12-29 DIAGNOSIS — M2619 Other specified anomalies of jaw-cranial base relationship: Secondary | ICD-10-CM | POA: Insufficient documentation

## 2016-12-29 DIAGNOSIS — R51 Headache: Secondary | ICD-10-CM

## 2016-12-29 DIAGNOSIS — R519 Headache, unspecified: Secondary | ICD-10-CM | POA: Insufficient documentation

## 2016-12-29 NOTE — Procedures (Signed)
PATIENT'S NAME:  Erika [name], Erika [name] DOB:      [birth year]      MR#:    [id number]     DATE OF RECORDING: 12/24/2016 REFERRING M.D.:  Cardiologist Dr. Rosemary Holms Study Performed:  Split-Night Titration Study HISTORY:  Erika [name] is a 41 y.o. female, seen here for a sleep study requested by Dr. Rosemary Holms. She is according to her mother's observation snoring very loudly, for many years. She gained weight over the last 5 years, partially attributed to depression. The patient has a long psychiatric history, has a learning disability (Dr. Lolly Mustache) and gained weight over the last years due to psychotropic medication, she developed hypertension, and she was witnessed to have apnea after a medical procedure. The patient endorsed the Epworth Sleepiness Scale at 5 points   The patient's weight 261 pounds with a height of 63 (inches), resulting in a BMI of 46.1 kg/m2. The patient's neck circumference measured 18 inches.  CURRENT MEDICATIONS: Abilify, Lipitor, Lotensin, Vitamin D, Synjardy XR, Papaya enzyme, Zantac, Zoloft, Restoril   PROCEDURE:  This is a multichannel digital polysomnogram utilizing the Somnostar 11.2 system.  Electrodes and sensors were applied and monitored per AASM Specifications.   EEG, EOG, Chin and Limb EMG, were sampled at 200 Hz.  ECG, Snore and Nasal Pressure, Thermal Airflow, Respiratory Effort, CPAP Flow and Pressure, Oximetry was sampled at 50 Hz. Digital video and audio were recorded.      BASELINE STUDY WITHOUT CPAP RESULTS: Lights Out was at 20:56 and Lights On at 04:57.  Total recording time (TRT) was 145, with a total sleep time (TST) of 127 minutes.   The patient's sleep latency was 19.5 minutes.  REM latency was 0 minutes.  The sleep efficiency was 87.6 %.    SLEEP ARCHITECTURE: WASO (Wake after sleep onset) was 6 minutes, Stage N1 was 4 minutes, Stage N2 was 123 minutes, Stage N3 was 0 minutes and Stage R (REM sleep) was 0 minutes.  The percentages were Stage N1 3.1%,  Stage N2 96.9%, Stage N3 0% and Stage R (REM sleep) 0%.   RESPIRATORY ANALYSIS:  There were a total of 94 respiratory events:  1 obstructive apnea, 0 central apneas and 6 mixed apneas with 87 hypopneas. The patient also had 0 respiratory event related arousals (RERAs). Snoring was noted. The total APNEA/HYPOPNEA INDEX (AHI) was 44.4 /hour and the total RESPIRATORY DISTURBANCE INDEX was 44.4 /hour.  0 events occurred in REM sleep and 180 events in NREM. The REM AHI was 0.0 /hour versus a non-REM AHI of 44.4 /hour. The patient spent 440 minutes sleep time in the supine position and 0 minutes in non-supine. The supine AHI was 44.4 /hour versus a non-supine AHI of 0.0 /hour.  OXYGEN SATURATION & C02:  The wake baseline 02 saturation was 94%, with the lowest being 85%. Time spent below 89% saturation equaled 3 minutes. End Tidal CO2 during sleep was not measured.  PERIODIC LIMB MOVEMENTS:   The patient had a total of 6 Periodic Limb Movements.  The Periodic Limb Movement (PLM) index was 2.8 /hour and the PLM Arousal index was 0 /hour. The arousals were noted as: 19 were spontaneous, 0 were associated with PLMs, and 16 were associated with respiratory events.   Audio and video analysis did not show any abnormal or unusual movements, behaviors, phonations or vocalizations Loud Snoring was noted. Her EKG was in keeping with normal sinus rhythm (NSR)  TITRATION STUDY WITH CPAP RESULTS:   CPAP was initiated at 5  cmH20 with heated humidity per AASM split night standards and pressure was advanced to CPAP at 15 cmH20 because of hypopneas, apneas and desaturations. The AHI was still very high and technologist changed to BiPAP.  At a BiPAP pressure of 15/10 and finally 17/12 cmH20, there was still no significant reduction of the AHI, but the SpO2 nadir was corrected to 90%.   Total recording time (TRT) was 336 minutes, with a total sleep time (TST) of 313 minutes. The patient's sleep latency was 8 minutes. REM  latency was 74 minutes.  The sleep efficiency was 93.2 %.  Wake after sleep was 13.5 minutes, Stage N1 5 minutes, Stage N2 233 minutes, Stage N3 10.5 minutes and Stage R (REM sleep) 64.5 minutes. The percentages were: Stage N1 1.6%, Stage N2 74.4%, Stage N3 3.4% and Stage R (REM sleep) 20.6%.   RESPIRATORY ANALYSIS:  There were a total of 59 respiratory events: 2 obstructive apneas, 12 central apneas and 16 mixed apneas with a total of 30 apneas and an apnea index (AI) of 5.8.  There were 29 hypopneas with a hypopnea index of 5.6 /hour. The patient also had 0 respiratory event related arousals (RERAs).The total APNEA/HYPOPNEA INDEX  (AHI) was 11.3 /hour and the total RESPIRATORY DISTURBANCE INDEX was 11.3 /hour.  15 events occurred in REM sleep and 44 events in NREM. The REM AHI was 14. /hour versus a non-REM AHI of 10.6 /hour. REM sleep was achieved on a pressure of CPAP at 13 cm/H20 (AHI was 19.1). The patient spent 100% of total sleep time in the supine position. The supine AHI was 11.4 /hour, versus a non-supine AHI of 0.0/hour.  OXYGEN SATURATION & C02:  The wake baseline 02 saturation was 92%, with the lowest being 80%. Time spent below 89% saturation equaled 10 minutes.  PERIODIC LIMB MOVEMENTS:    The patient had a total of 53 Periodic Limb Movements. The Periodic Limb Movement (PLM) index was 10.2 /hour and the PLM Arousal index was 1.2 /hour. The arousals were noted as: 33 were spontaneous, 6 were associated with PLMs, and 19 were still associated with respiratory events.  Post-study, the patient indicated that sleep was better than usual.  POLYSOMNOGRAPHY IMPRESSION :   1. Severe Obstructive Sleep Apnea (OSA) and hypopnea, not responding to CPAP nor limited BiPAP therapy. 2. Loud Snoring.  RECOMMENDATIONS: I will ask Mrs. Orvan FalconerCampbell to return for a full night BiPAP titration, beginning at 8/4 cm water pressure. We may progress to another modality if needed. An optimal treatment pressure was  not identified in this study. 1. The patient may need oxygen supplementation.   2. A nasal pillow in large size was used with heated humidity during this study.  Advise to add heated humidity.  Adjust interface and heated humidity as needed.     3. Advise to lose weight, by diet and exercise if not contraindicated (BMI 45). 4. A follow up appointment will be scheduled in the Sleep Clinic at [facility].      I certify that I have reviewed the entire raw data recording prior to the issuance of this report in accordance with the Standards of Accreditation of the American Academy of Sleep Medicine (AASM)     Melvyn Novasarmen Furqan Gosselin, M.D.  12-29-2016  Diplomat, American Board of Psychiatry and Neurology  Diplomat, American Board of Sleep Medicine Medical Director, [facility]

## 2016-12-29 NOTE — Telephone Encounter (Signed)
-----   Message from Carmen Dohmeier, MD sent at 12/29/2016  9:19 AM EDT ----- Severe OSA with mostly hypopnea, not hypoxemia.  CPAP and BiPAP were applied- limited exposure to the last modality- have not alleviated her AHI in a satisfactory fashion. I will ask her to return for BiPAP full night, starting low at 8/4 cm . CD  [facility], Dr. Patwardhan 

## 2016-12-29 NOTE — Addendum Note (Signed)
Addended by: Melvyn NovasHMEIER, Remmie Bembenek on: 12/29/2016 09:19 AM   Modules accepted: Orders

## 2016-12-29 NOTE — Telephone Encounter (Signed)
Called the patient to discuss sleep study results. No answer at this time. LVM for the patient to call back.  

## 2017-01-04 ENCOUNTER — Telehealth: Payer: Self-pay | Admitting: Neurology

## 2017-01-04 NOTE — Telephone Encounter (Signed)
-----   Message from Melvyn Novasarmen Dohmeier, MD sent at 12/29/2016  9:19 AM EDT ----- Severe OSA with mostly hypopnea, not hypoxemia.  CPAP and BiPAP were applied- limited exposure to the last modality- have not alleviated her AHI in a satisfactory fashion. I will ask her to return for BiPAP full night, starting low at 8/4 cm . CD  [facility], Dr. Rosemary HolmsPatwardhan

## 2017-01-04 NOTE — Telephone Encounter (Signed)
Called pt again to discuss SSR. No answer. LVM for the pt to call back.

## 2017-01-13 ENCOUNTER — Telehealth: Payer: Self-pay | Admitting: Neurology

## 2017-01-13 NOTE — Telephone Encounter (Signed)
-----   Message from Carmen Dohmeier, MD sent at 12/29/2016  9:19 AM EDT ----- Severe OSA with mostly hypopnea, not hypoxemia.  CPAP and BiPAP were applied- limited exposure to the last modality- have not alleviated her AHI in a satisfactory fashion. I will ask her to return for BiPAP full night, starting low at 8/4 cm . CD  [facility], Dr. Patwardhan 

## 2017-01-13 NOTE — Telephone Encounter (Signed)
Was able to reach the patient on her cell phone number this time and I went over the sleep study results. I informed her that she would need to come in for a Bipap titration study. Pt stated that the sleep lab had already contacted her to get her set up and she is scheduled to come in on Jan 28, 2017. Pt verbalized understanding. Pt had no questions at this time but was encouraged to call back if questions arise.

## 2017-01-28 ENCOUNTER — Ambulatory Visit (INDEPENDENT_AMBULATORY_CARE_PROVIDER_SITE_OTHER): Payer: Medicaid Other | Admitting: Neurology

## 2017-01-28 DIAGNOSIS — R519 Headache, unspecified: Secondary | ICD-10-CM

## 2017-01-28 DIAGNOSIS — G4733 Obstructive sleep apnea (adult) (pediatric): Secondary | ICD-10-CM | POA: Diagnosis not present

## 2017-01-28 DIAGNOSIS — G4710 Hypersomnia, unspecified: Secondary | ICD-10-CM

## 2017-01-28 DIAGNOSIS — R0683 Snoring: Secondary | ICD-10-CM

## 2017-01-28 DIAGNOSIS — F70 Mild intellectual disabilities: Secondary | ICD-10-CM

## 2017-01-28 DIAGNOSIS — M2619 Other specified anomalies of jaw-cranial base relationship: Secondary | ICD-10-CM

## 2017-01-28 DIAGNOSIS — R51 Headache: Secondary | ICD-10-CM

## 2017-01-28 DIAGNOSIS — G4730 Sleep apnea, unspecified: Secondary | ICD-10-CM

## 2017-01-28 DIAGNOSIS — K219 Gastro-esophageal reflux disease without esophagitis: Secondary | ICD-10-CM

## 2017-01-28 DIAGNOSIS — E118 Type 2 diabetes mellitus with unspecified complications: Secondary | ICD-10-CM

## 2017-02-02 ENCOUNTER — Telehealth: Payer: Self-pay | Admitting: Neurology

## 2017-02-02 ENCOUNTER — Other Ambulatory Visit: Payer: Self-pay | Admitting: Neurology

## 2017-02-02 DIAGNOSIS — G4733 Obstructive sleep apnea (adult) (pediatric): Secondary | ICD-10-CM

## 2017-02-02 DIAGNOSIS — M2619 Other specified anomalies of jaw-cranial base relationship: Secondary | ICD-10-CM

## 2017-02-02 DIAGNOSIS — E662 Morbid (severe) obesity with alveolar hypoventilation: Secondary | ICD-10-CM

## 2017-02-02 DIAGNOSIS — R519 Headache, unspecified: Secondary | ICD-10-CM

## 2017-02-02 DIAGNOSIS — F70 Mild intellectual disabilities: Secondary | ICD-10-CM

## 2017-02-02 DIAGNOSIS — F201 Disorganized schizophrenia: Secondary | ICD-10-CM

## 2017-02-02 DIAGNOSIS — R51 Headache: Secondary | ICD-10-CM

## 2017-02-02 NOTE — Telephone Encounter (Signed)
-----   Message from Melvyn Novasarmen Dohmeier, MD sent at 02/02/2017 11:13 AM EST ----- DISCUSSION: Patient had failed CPAP during preceding SPLIT sleep study, did beter on BiPAP and very well on BiPAP ST .    I will order BiPAP ST of 10 /min and a slightly  higher pressure of 18/14 cm water. The patient was fitted with a small size Simplus FFM apparatus  by [employer].   Cc to Dr. Rosemary HolmsPatwardhan, please- he referred. CD

## 2017-02-02 NOTE — Telephone Encounter (Signed)
I called pt. I advised pt that Dr. Vickey Hugerohmeier reviewed their sleep study results and found that pt had sleep apnea. Dr. Vickey Hugerohmeier recommends that pt starts Bipap. I reviewed PAP compliance expectations with the pt. Pt is agreeable to starting a BiPAP. I advised pt that an order will be sent to a DME, [facility], and [facility] will call the pt within about one week after they file with the pt's insurance. [facility] will show the pt how to use the machine, fit for masks, and troubleshoot the BiPAP if needed. A follow up appt was made for insurance purposes with Dr. Vickey Hugerohmeier on May 04, 2017 at 11:00 am. Pt verbalized understanding to arrive 15 minutes early and bring their BiPAP. A letter with all of this information in it will be mailed to the pt as a reminder. I verified with the pt that the address we have on file is correct. Pt verbalized understanding of results. Pt had no questions at this time but was encouraged to call back if questions arise.

## 2017-02-02 NOTE — Procedures (Signed)
PATIENT'S NAME:  Erika [name], Erika [name] DOB:      [birth year]      MR#:    [id number]     DATE OF RECORDING: 01/28/2017 REFERRING M.D.:  Cardiologist Dr.  Rosemary HolmsPatwardhan, MD @ [facility] Ralene Okoy Moreira, MD Study Performed:   Titration to BiPAP  HISTORY:  Erika [name] is a 41 y.o. female and according to her mother's observation she is snoring very loudly, for many years. She is morbidly obese, partially attributed to depression and medication. The patient has a long psychiatric history, has a learning disability. The patient was seen for a SPLIT night PSG on 12/24/2016, which revealed severe OSA with mostly hypopnea, not hypoxemia. CPAP and BiPAP were applied- limited exposure to the last modality- both failed to alleviate her AHI in a satisfactory fashion. I will ask her to return for BiPAP full night, starting low at 8/4 cm.  The patient endorsed the Epworth Sleepiness Scale at 5 points.  The patient's weight 260 pounds with a height of 63 (inches), resulting in a BMI of 46.1 kg/m2.The patient's neck circumference measured 18 inches.  CURRENT MEDICATIONS: Abilify, Lipitor, Lotensin, Vitamin D, Synjardy XR, Papaya enzyme, Zantac, Zoloft, Restoril  PROCEDURE:  This is a multichannel digital polysomnogram utilizing the SomnoStar 11.2 system.  Electrodes and sensors were applied and monitored per AASM Specifications.   EEG, EOG, Chin and Limb EMG, were sampled at 200 Hz.  ECG, Snore and Nasal Pressure, Thermal Airflow, Respiratory Effort, CPAP Flow and Pressure, Oximetry was sampled at 50 Hz. Digital video and audio were recorded.      BiPAP was initiated at 8/5 cmH20 with heated humidity per AASM split night standards and pressure was advanced to 17/13 cmH20 because of hypopneas, apneas and desaturations.  At a BiPAP ST of 10 and pressure of 17/13 cmH20, there was a reduction of the AHI to 3.8 with improvement of sleep apnea.  The patient slept 63 minutes at that pressure and ST of 10 /min.  setting, with a nadir SpO2 of 89%. None of the explored pressures completely alleviated this patient's apnea.   Lights Out was at 20:49 and Lights On at 05:03. Total recording time (TRT) was 494.5 minutes, with a total sleep time (TST) of 465.5 minutes. The patient's sleep latency was 1.5 minutes with 0.5 minutes of wake time after sleep onset. REM latency was 134 minutes.  The sleep efficiency was 94.1 %.    SLEEP ARCHITECTURE: WASO (Wake after sleep onset) was 27 minutes.  There were 16 minutes in Stage N1, 283 minutes Stage N2, 37.5 minutes Stage N3 and 129 minutes in Stage REM.  The percentage of Stage N1 was 3.4%, Stage N2 was 60.8%, Stage N3 was 8.1% and Stage R (REM sleep) was 27.7%.   RESPIRATORY ANALYSIS:  There was a total of 140 respiratory events: 21 obstructive apneas, 67 central apneas and 0 mixed apneas with a total of 88 apneas and an apnea index (AI) of 11.3 /hour. There were 52 hypopneas with a hypopnea index of 6.7/hour. The patient also had 0 respiratory event related arousals (RERAs).     The total APNEA/HYPOPNEA INDEX  (AHI) was 18.1 /hour and the total RESPIRATORY DISTURBANCE INDEX was 18.1/hr. 54 events occurred in REM sleep and 86 events in NREM. The REM AHI was 25.1 /hour versus a non-REM AHI of 15.3 /hour.  The patient spent 403 minutes of total sleep time in the supine position and 63 minutes in non-supine. The supine AHI was 20.4, versus a  non-supine AHI of 2.9.  OXYGEN SATURATION & C02:  The baseline 02 saturation was 92%, with the lowest being 77%. Time spent below 89% saturation equaled 47 minutes.  PERIODIC LIMB MOVEMENTS:    The patient had a total of 10 Periodic Limb Movements. The Periodic Limb Movement (PLM) index was 1.3 and the PLM Arousal index was 0 /hour. The arousals were noted as: 36 were spontaneous, 0 were associated with PLMs, and 16 were associated with respiratory events.  Audio and video analysis did not show any abnormal or unusual movements,  behaviors, phonations or vocalizations.   The patient took two bathroom breaks. Snoring was noted until the very last explored pressure setting alleviated it. EKG was in keeping with normal sinus rhythm (NSR).  Post-study, the patient indicated that sleep was much better than usual.  The patient was fitted with a small size Simplus FFM apparatus by [employer].  DIAGNOSIS 1. Obstructive Sleep Apnea, which failed to respond to CPAP and simple BiPAP was responsive to BiPAP ST of 10 /min and pressure of 17/13 cm water.  2. The patient was fitted with a small size Simplus FFM apparatus by [employer].  PLANS/RECOMMENDATIONS: BiPAP ST of 10 /min and pressure of 17/13 cm water.  The patient was fitted with a small size Simplus FFM apparatus by [employer].           1. a. BIPAP ST as above will be ordered - sleep clinic follow up in 2-3 months after receiving device; and thereafter, yearly CPAP clinic follow-up is advisable. Compliance is defined as 4 hours or more of nightly use.  b. Weight loss is to be enforced. c. Avoidance of medications with muscle relaxant properties. d. Avoidance of ingestion of alcohol prior to sleeping (if applicable). e. Avoiding sleeping in the supine position (on one's back). f. Improvement of nasal patency if indicated.  2. Positional therapy:  This study indicates that patient has significantly more apneas in the supine position.  It is recommended that the patient use a device which encourages sleep in the non-supine position (e.g., tennis ball-in-back method, using fanny pack or similar device to position elastic ball in small of back during sleep, in order to encourage sleep in the non-supine position). 3. Sleep hygiene:  Sleep hygiene measures should be carefully reviewed with the patient by the primary provider.  Check the bedroom environment for light, noise, temperature, or other factors which could disrupt sleep; use bedroom for sleeping only;  follow a regular schedule for sleeping and waking (goal is seven to eight hours sleep per night, but patient should not be in bed any time he or she does not feel like sleeping); limit naps to no more than one hour and no later than mid-afternoon; medically-supervised exercise plan, with exercise in late afternoon and no later than three hours prior to bedtime; avoid eating just before bed; avoid caffeine for six hours before bedtime; avoid alcohol; avoid tobacco; engage only in relaxing activities prior to bed, such as quiet music, reading under soft light, relaxation exercises such as deep breathing or progressive relaxation.  Avoid looking at alarm clock in bed; turn alarm clock facing away from patient.  If the patient worries in bed, one helpful intervention is to have the patient write down a list of concerns nightly before bed; and then to put them out of mind by discarding the list, after the patient confirms that there is nothing more he/she can do about them today.  1. DISCUSSION: I will order BiPAP ST of 10 /min and a slightly higher pressure of 18/14 cm water.  3. The patient was fitted with a small size Simplus FFM apparatus by [employer].   A follow up appointment will be scheduled in the Sleep Clinic at [facility].   Please call [phone] with any questions.      I certify that I have reviewed the entire raw data recording prior to the issuance of this report in accordance with the Standards of Accreditation of the American Academy of Sleep Medicine (AASM)    Melvyn Novas, M.D.     02-02-2017  Diplomat, American Board of Psychiatry and Neurology  Diplomat, American Board of Sleep Medicine Medical Director of [facility] at [facility]

## 2017-03-24 ENCOUNTER — Ambulatory Visit (INDEPENDENT_AMBULATORY_CARE_PROVIDER_SITE_OTHER): Payer: Medicaid Other | Admitting: Psychiatry

## 2017-03-24 ENCOUNTER — Encounter (HOSPITAL_COMMUNITY): Payer: Self-pay | Admitting: Psychiatry

## 2017-03-24 DIAGNOSIS — F200 Paranoid schizophrenia: Secondary | ICD-10-CM | POA: Diagnosis not present

## 2017-03-24 MED ORDER — SERTRALINE HCL 100 MG PO TABS: 150.0000 mg | ORAL_TABLET | Freq: Every day | ORAL | 1 refills | Status: DC

## 2017-03-24 MED ORDER — TEMAZEPAM 15 MG PO CAPS: 15.0000 mg | ORAL_CAPSULE | Freq: Every day | ORAL | 1 refills | Status: DC

## 2017-03-24 MED ORDER — ARIPIPRAZOLE 10 MG PO TABS: 10.0000 mg | ORAL_TABLET | Freq: Every day | ORAL | 1 refills | Status: DC

## 2017-03-24 NOTE — Progress Notes (Signed)
BH MD/PA/NP OP Progress Note  03/24/2017 3:51 PM Erika [name]  MRN:  [id number]  Chief Complaint: I am doing good.  I had a good Christmas.  I am sleeping better now I am using CPAP machine.  HPI: Patient came for her follow-up appointment with her mother.  She is taking the medication as prescribed and reported no side effects.  Her mother endorsed recently she had a sleep study and now she is using CPAP machine which is really helping her a lot.  She is no longer snoring in the morning she is more fresh and relaxed.  She is working at [employer] 5 days a week.  She really enjoy her work there.  She told she had a good Christmas.  She denies any mania, psychosis, hallucination or any suicidal thoughts.  She recently seen her primary care physician in November and she had blood work.  Her hemoglobin A1c is 6.6, glucose 143, BUN 7, creatinine 0.5 liver enzymes are normal, electrolytes are normal.  She saw cardiologist and she was told EKG is normal.  No new medication.  She wants to continue her current psychiatric medication.  She has no rash, tremors, shakes or any EPS.  Her vital signs are stable.  Visit Diagnosis:    ICD-10-CM   1. Paranoid schizophrenia (HCC) F20.0 temazepam (RESTORIL) 15 MG capsule    sertraline (ZOLOFT) 100 MG tablet    ARIPiprazole (ABILIFY) 10 MG tablet    Past Psychiatric History: Viewed. Patient has history of psychiatric illness started in her teens. She is at least 3 [facility]. She is total 6 psychiatric hospitalization. She was diagnosed with schizophrenia . Most of the psychiatric admission was precipitated by paranoid thinking and noncompliance with the medication. Patient denies any history of suicidal attempt.   Past Medical History:  Past Medical History:  Diagnosis Date  . Anxiety   . Depression   . Diabetes (HCC)   . GERD (gastroesophageal reflux disease)   . GERD (gastroesophageal reflux disease)    . Hyperlipemia   . Hyperlipidemia   . Obesity     Past Surgical History:  Procedure Laterality Date  . ABDOMINAL HYSTERECTOMY    . CHOLECYSTECTOMY    . ESOPHAGOGASTRODUODENOSCOPY (EGD) WITH PROPOFOL N/A 07/07/2016   Procedure: ESOPHAGOGASTRODUODENOSCOPY (EGD) WITH PROPOFOL;  Surgeon: Carman ChingEdwards, James, MD;  Location: [facility] ENDOSCOPY;  Service: Endoscopy;  Laterality: N/A;    Family Psychiatric History: Reviewed  Family History:  Family History  Problem Relation Age of Onset  . Hypertension Mother   . Hyperlipidemia Mother   . Diabetes Mother   . Diabetes Father   . Hypertension Father     Social History:  Social History   Socioeconomic History  . Marital status: Single    Spouse name: None  . Number of children: None  . Years of education: None  . Highest education level: None  Social Needs  . Financial resource strain: None  . Food insecurity - worry: None  . Food insecurity - inability: None  . Transportation needs - medical: None  . Transportation needs - non-medical: None  Occupational History  . None  Tobacco Use  . Smoking status: Never Smoker  . Smokeless tobacco: Never Used  Substance and Sexual Activity  . Alcohol use: No    Alcohol/week: 0.0 oz  . Drug use: No  . Sexual activity: Not Currently    Birth control/protection: Surgical  Other Topics Concern  . None  Social History  Narrative  . None    Allergies:  Allergies  Allergen Reactions  . 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  ARIPiprazole (ABILIFY) 10 MG tablet Take 1 tablet (10 mg total) by mouth daily. 90 tablet 1  . atorvastatin (LIPITOR) 20 MG tablet Take 20 mg by mouth at bedtime.    . benazepril (LOTENSIN) 10 MG tablet Take 10 mg by mouth daily.    . cholecalciferol (VITAMIN D) 1000 units tablet Take 1,000 Units by mouth daily.    . Empagliflozin-Metformin HCl ER (SYNJARDY XR) 11-998 MG TB24 Take 0.5 tablets by mouth at bedtime.    . Papaya Enzyme CHEW Chew 1 each by mouth 3 (three) times daily after meals.    . ranitidine (ZANTAC) 300 MG capsule Take 300 mg by mouth 2 (two) times daily.    . sertraline (ZOLOFT) 100 MG tablet Take 1.5 tablets (150 mg total) by mouth daily. 135 tablet 1  . temazepam (RESTORIL) 15 MG capsule Take 1 capsule (15 mg total) by mouth at bedtime. 90 capsule 1   No current facility-administered medications for this visit.      Musculoskeletal: Strength & Muscle Tone: within normal limits Gait & Station: normal Patient leans: N/A  Psychiatric Specialty Exam: ROS  Blood pressure 122/76, pulse 99, height 5' (1.524 m), weight 267 lb (121.1 kg), SpO2 95 %.Body mass index is 52.14 kg/m.  General Appearance: Casual and Shy  Eye Contact:  Fair  Speech:  Slow  Volume:  Decreased  Mood:  Anxious  Affect:  Appropriate  Thought Process:  Goal Directed  Orientation:  Full (Time, Place, and Person)  Thought Content: Logical   Suicidal Thoughts:  No  Homicidal Thoughts:  No  Memory:  Immediate;   Fair Recent;   Good Remote;   Fair  Judgement:  Good  Insight:  Good  Psychomotor Activity:  Normal  Concentration:  Concentration: Fair and Attention Span: Fair  Recall:  Good  Fund of Knowledge: Good  Language: Good  Akathisia:  No  Handed:  Right  AIMS (if indicated): not done  Assets:  Communication Skills Desire for Improvement Housing Resilience Social Support  ADL's:  Intact  Cognition: WNL  Sleep:  Good   Screenings:   Assessment and Plan: Schizophrenia chronic  paranoid type.  Patient doing better on her current medication.  I reviewed blood work results from her primary care physician.  Encourage healthy lifestyle and watch her calorie intake and do regular exercise.  Continue Zoloft 150 mg daily, temazepam 15 mg at bedtime 510 mg daily.  Discussed medication side effects and benefits.  She has no tremors shakes or any EPS.  Recommended to call us back if she has any question or any concern.  Follow-up in 6 months.   Cleotis Nipper, MD 03/24/2017, 3:51 PM

## 2017-05-03 ENCOUNTER — Encounter: Payer: Self-pay | Admitting: Neurology

## 2017-05-04 ENCOUNTER — Encounter (INDEPENDENT_AMBULATORY_CARE_PROVIDER_SITE_OTHER): Payer: Self-pay

## 2017-05-04 ENCOUNTER — Encounter: Payer: Self-pay | Admitting: Neurology

## 2017-05-04 ENCOUNTER — Ambulatory Visit: Payer: Medicaid Other | Admitting: Neurology

## 2017-05-04 VITALS — BP 148/97 | HR 75 | Ht 63.0 in | Wt 269.0 lb

## 2017-05-04 DIAGNOSIS — G4733 Obstructive sleep apnea (adult) (pediatric): Secondary | ICD-10-CM | POA: Diagnosis not present

## 2017-05-04 DIAGNOSIS — G4731 Primary central sleep apnea: Secondary | ICD-10-CM | POA: Diagnosis not present

## 2017-05-04 DIAGNOSIS — M2619 Other specified anomalies of jaw-cranial base relationship: Secondary | ICD-10-CM

## 2017-05-04 DIAGNOSIS — G4739 Other sleep apnea: Secondary | ICD-10-CM

## 2017-05-04 DIAGNOSIS — Z789 Other specified health status: Secondary | ICD-10-CM | POA: Diagnosis not present

## 2017-05-04 NOTE — Progress Notes (Signed)
SLEEP MEDICINE CLINIC   Provider:  Melvyn Novas, M D  Primary Care Physician:  Ralene Ok, MD   Referring Provider: [facility] - Dr. Rosemary Holms   Chief Complaint  Patient presents with  . Follow-up    pt with mom, rm 10. pt states that she is sleeping better now since being on the Bipap machine. DME [facility]    HPI:  Erika [name] is a 42 y.o. female , seen here as in a referral/ revisit  from Dr. Verl Dicker Partner , Dr. Rosemary Holms.   Interval history from 04 May 2017, I have the pleasure of meeting with Erika [name] and her mother today for the first time since her sleep studies were performed.  The patient underwent a split-night polysomnography on 24 December 2016 and the study confirmed that he has a high degree of apnea with an AHI of 44.4/h of sleep.  This means that she has an apnea about every 80 seconds.  She spent all night in supine position so we could not determine if there was a positional component, and she did not reach REM sleep either.  She did not have significant low oxygen saturations none of prolonged duration however.  She was started on CPAP first was 5 cmH2O and was step-by-step titrated to 15 CPAP caused central apneas to emerge and did not reduce the overall apnea index.  The patient was therefore changed briefly to BiPAP but at that time the night was almost over.  Pressures of 15/10 centimeter and 17/12 centimeters were tried.  We asked the patient to return for a full night BiPAP titration based on these limited results.  She also developed some periodic limb movements at night but were not present in her first study.  She returned for a full night titration on 30 November, BiPAP was now initiated at a low pressure and slowly advanced.  The first pressure was 8/5 cmH2O and she finally reached 17/13 cmH2O but her AHI was reduced to 3.8 apneas per hour of sleep from previously over 44.  Snoring also was alleviated at that higher pressure.  She was  placed on a full facemask which I had initially not wanted but he tolerated a small size Simplus fullface mask very well.   [name] mother is actually more thrilled than the patient about the results.  She no longer hears her daughter snoring or have apnea, the machine is very quiet and it has improved her own sleep significantly.  [name] has been very compliant she has used the machine over 4 hours nightly for 26 out of the last 30 days, and she has used it on every of the last 30 days.  Average use of time is 5 hours and 49 minutes, the machine is set at 18/14 cmH2O BiPAP with a background respiratory rate of 10 breaths/min, her residual AHI is 0.0.  There are no more apneas.  She does have some high air leaks but these do not affect her sleep quality or the apnea control.  If troponin once I would change her to a nasal pillow or nasal mask.     Consult : She is according to her mother's observation snoring very loudly, for many years. She gained weight over the last 5 years, partially attributed to depression. The patient has a long psychiatric history, has a learning disability( Dr. Lolly Mustache)  She Gained weight over the last years due to psychotropic medication for the treatment of depression, she also has developed hypertension,  and she was witnessed to have apnea after a medical procedure, but she cannot remember what kind of procedures this was. Sleep habits are as follows: Erika [name] usually watches TV for the last hours of the day before she goes to bed. Her bedtime is around 10 PM, and she retreats to her bedroom at this time. Her bedroom is cool quiet and dark. She sleeps on her side. She avoids the right side because of an ophthalmological condition. She wears a mask for eye protection to sleep. She wakes up frequently to go to the bathroom at night at least 2 times. She rises in the morning and 5:15. Usually she has got 6 hours of sleep by that time. She feels usually rested and  restored in the morning but also reports a dry mouth. She likes to chew gum to give her relief from the dry mouth. She also drinks a lot of water during the day, but has to be reminded.   Sleep medical history and family sleep history: There is no other known family history of sleep apnea, but Erika [name] has been observed reading irregular in her sleep. She has diabetes, HTN, super obesity. Abnormal EKG- "flutter " .  She is on a sleep walker, has no history of night terrors or enuresis. She never underwent tonsillectomy and has no history of neck surgery or trauma.   Social history: Erika [name] lives with her parents, she is usually the first one in bed and rises without resistance and the morning. She goes to an adult enrichment center, and enjoys it. She does not use tobacco in any form, she does not drink alcohol, caffeine use she does drink coffee, soda and ice tea. She does not drink more than 1 or 2 cups of caffeinated beverage a day. Mostly soda.  Review of Systems: Out of a complete 14 system review, the patient complains of only the following symptoms, and all other reviewed systems are negative.  Epworth score  5 , Fatigue severity score 18 , depression score n/a   Retrognathia. Significantly recessed mandibula   Social History   Socioeconomic History  . Marital status: Single    Spouse name: Not on file  . Number of children: Not on file  . Years of education: Not on file  . Highest education level: Not on file  Social Needs  . Financial resource strain: Not on file  . Food insecurity - worry: Not on file  . Food insecurity - inability: Not on file  . Transportation needs - medical: Not on file  . Transportation needs - non-medical: Not on file  Occupational History  . Not on file  Tobacco Use  . Smoking status: Never Smoker  . Smokeless tobacco: Never Used  Substance and Sexual Activity  . Alcohol use: No    Alcohol/week: 0.0 oz  . Drug use: No  . Sexual  activity: Not Currently    Birth control/protection: Surgical  Other Topics Concern  . Not on file  Social History Narrative  . Not on file    Family History  Problem Relation Age of Onset  . Hypertension Mother   . Hyperlipidemia Mother   . Diabetes Mother   . Diabetes Father   . Hypertension Father     Past Medical History:  Diagnosis Date  . Anxiety   . Depression   . Diabetes (HCC)   . GERD (gastroesophageal reflux disease)   . GERD (gastroesophageal reflux disease)   . Hyperlipemia   .  Hyperlipidemia   . Obesity     Past Surgical History:  Procedure Laterality Date  . ABDOMINAL HYSTERECTOMY    . CHOLECYSTECTOMY    . ESOPHAGOGASTRODUODENOSCOPY (EGD) WITH PROPOFOL N/A 07/07/2016   Procedure: ESOPHAGOGASTRODUODENOSCOPY (EGD) WITH PROPOFOL;  Surgeon: Carman Ching, MD;  Location: [facility];  Service: Endoscopy;  Laterality: N/A;    Current Outpatient Medications  Medication Sig Dispense Refill  . ARIPiprazole (ABILIFY) 10 MG tablet Take 1 tablet (10 mg total) by mouth daily. 90 tablet 1  . atorvastatin (LIPITOR) 20 MG tablet Take 20 mg by mouth at bedtime.    . benazepril (LOTENSIN) 10 MG tablet Take 10 mg by mouth daily.    . cholecalciferol (VITAMIN D) 1000 units tablet Take 1,000 Units by mouth daily.    . Empagliflozin-Metformin HCl ER (SYNJARDY XR) 11-998 MG TB24 Take 0.5 tablets by mouth at bedtime.    . Papaya Enzyme CHEW Chew 1 each by mouth 3 (three) times daily after meals.    . ranitidine (ZANTAC) 300 MG capsule Take 300 mg by mouth 2 (two) times daily.    . sertraline (ZOLOFT) 100 MG tablet Take 1.5 tablets (150 mg total) by mouth daily. 135 tablet 1  . temazepam (RESTORIL) 15 MG capsule Take 1 capsule (15 mg total) by mouth at bedtime. 90 capsule 1   No current facility-administered medications for this visit.     Allergies as of 05/04/2017 - Review Complete 05/04/2017  Allergen Reaction Noted  . Haldol [haloperidol decanoate] Other (See  Comments) 02/03/2011    Vitals: BP (!) 148/97   Pulse 75   Ht 5\' 3"  (1.6 m)   Wt 269 lb (122 kg)   BMI 47.65 kg/m  Last Weight:  Wt Readings from Last 1 Encounters:  05/04/17 269 lb (122 kg)   UJW:JXBJ mass index is 47.65 kg/m.     Last Height:   Ht Readings from Last 1 Encounters:  05/04/17 5\' 3"  (1.6 m)    Physical exam:  General: The patient is awake, alert and appears not in acute distress. The patient is well groomed. Head: Normocephalic, atraumatic. Neck is supple. Mallampati 5 ,  neck circumference:18". Nasal airflow restrcicted, TMJ is not  evident . 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  Cardiovascular:  Regular rate and rhythm , without  murmurs or carotid bruit, and without distended neck veins. Respiratory: Lungs are clear to auscultation. Skin:  Without evidence of edema, or rash Trunk: BMI is 46. The patient's posture is stooped.   Neurologic exam : The patient is awake and alert, oriented to place and time.   Memory subjective described as intact.  Attention span & concentration ability appears normal.  Speech is fluent,  without dysarthria, dysphonia or aphasia.  Mood and affect are appropriate.  Cranial nerves: Pupils are equal and briskly reactive to light. Funduscopic exam without evidence of pallor or edema.  Extraocular movements  in vertical and horizontal planes intact and without nystagmus. Visual fields by finger perimetry are intact. Hearing to finger rub intact.  Facial sensation intact to fine touch.  Facial motor strength is symmetric and tongue and uvula move midline. Shoulder shrug was symmetrical.   Motor exam:  Normal tone, muscle bulk and symmetric strength in all extremities. Sensory:  Fine touch, pinprick and vibration were normal. Coordination: Finger-to-nose maneuver  normal without evidence of ataxia, dysmetria or tremor. Gait and station: Patient walks without assistive device and is able unassisted to climb up to the exam  table.  Strength within normal limits.Stance is stable and normal.   Turns with 4 Steps. Deep tendon reflexes: in the  upper and lower extremities are symmetric and intact. Babinski maneuver response is  downgoing.    Assessment:  After physical and neurologic examination, review of laboratory studies,  Personal review of imaging studies, reports of other /same  Imaging studies, results of polysomnography and / or neurophysiology testing and pre-existing records as far as provided in visit., my assessment is   1) severe obstructive sleep apnea- not responding to CPAP on BIPAP 18/14 cm with use of a FFM and high air leak, but AHI is 0.0/hr. SIMPLUS FFM in small was used by technologist. I like for the patient to have an alternative Nasal pillow.   She now wakes up ready for work, is refreshed and restored and has helped her mood.   2) Mrs. Egnew's weight gain seem to be correlated to the use of psychiatric medications and as these have over the last year been tapered she was also able to lose weight. She still is considered morbidly obese. She may benefit from medical weight management.  3) She also has a  high-grade Mallampati and retrognathia making her prone to have apnea independent of weight. This constellation makes response to FFM more difficult, as it applies pressure on the diminutive lower jaw, pushing back her chin.   The patient was advised of the nature of the diagnosed disorder , the treatment options and the  risks for general health and wellness arising from not treating the condition.   I spent more than 20  minutes of face to face time with the patient and her mother . I have reviewed the records of [facility].   Greater than 50% of time was spent in counseling and coordination of care. We have discussed the diagnosis and differential and I answered the patient's questions.    Plan:  Treatment plan and additional workup :   Continue BiPAP therapy,  I wil offer an  alternative nasal mask or nasal pillow for her -  DME is  [facility] in 12 month -  GO [place]e, GO !   Melvyn Novas, MD 05/04/2017, 12:25 PM  Certified in Neurology by ABPN Certified in Sleep Medicine by [facility] [address], [address] [place], [place] [address]

## 2017-05-13 ENCOUNTER — Telehealth: Payer: Self-pay | Admitting: *Deleted

## 2017-05-13 NOTE — Telephone Encounter (Signed)
Received compliance report (met compliance) from [facility] with request to send office notes to them from 05/04/17. Faxed office notes. Received a receipt of confirmation.

## 2017-09-13 ENCOUNTER — Ambulatory Visit (HOSPITAL_COMMUNITY): Payer: Medicaid Other | Admitting: Psychiatry

## 2017-10-19 ENCOUNTER — Other Ambulatory Visit (HOSPITAL_COMMUNITY): Payer: Self-pay

## 2017-10-19 DIAGNOSIS — F200 Paranoid schizophrenia: Secondary | ICD-10-CM

## 2017-10-19 MED ORDER — ARIPIPRAZOLE 10 MG PO TABS: 10.0000 mg | ORAL_TABLET | Freq: Every day | ORAL | 0 refills | Status: DC

## 2017-10-19 MED ORDER — TEMAZEPAM 15 MG PO CAPS: 15.0000 mg | ORAL_CAPSULE | Freq: Every day | ORAL | 0 refills | Status: DC

## 2017-10-19 MED ORDER — SERTRALINE HCL 100 MG PO TABS: 150.0000 mg | ORAL_TABLET | Freq: Every day | ORAL | 0 refills | Status: DC

## 2017-10-25 ENCOUNTER — Ambulatory Visit (HOSPITAL_COMMUNITY): Payer: Medicaid Other | Admitting: Psychiatry

## 2017-11-03 NOTE — Progress Notes (Signed)
[facility]  PATIENT: Erika [name] DOB: February 02, 1976   REASON FOR VISIT: Follow-up for obstructive sleep apnea here for BiPAP compliance HISTORY FROM: Patient and mom    HISTORY OF PRESENT ILLNESS:UPDATE 9/9/2019CM Erika [name], 42 year old female returns for follow-up with history of obstructive sleep apnea here for BiPAP compliance.  According to the mother she is doing well.  She has nasal pillows.  Data dated 10/08/2017-11/06/2017 shows compliance greater than 4 hours at 90% for 27 days.  Average usage 6 hours 2 minutes set pressure 14-18.  Respiratory rate 10.  AHI 3.7 ESS 4.  She returns for reevaluation Interval history from 04 May 2017,CD I have the pleasure of meeting with Erika [name] and her mother today for the first time since her sleep studies were performed.  The patient underwent a split-night polysomnography on 24 December 2016 and the study confirmed that he has a high degree of apnea with an AHI of 44.4/h of sleep.  This means that she has an apnea about every 80 seconds.  She spent all night in supine position so we could not determine if there was a positional component, and she did not reach REM sleep either.  She did not have significant low oxygen saturations none of prolonged duration however.  She was started on CPAP first was 5 cmH2O and was step-by-step titrated to 15 CPAP caused central apneas to emerge and did not reduce the overall apnea index.  The patient was therefore changed briefly to BiPAP but at that time the night was almost over.  Pressures of 15/10 centimeter and 17/12 centimeters were tried.  We asked the patient to return for a full night BiPAP titration based on these limited results.  She also developed some periodic limb movements at night but were not present in her first study.  She returned for a full night titration on 30 November, BiPAP was now initiated at a low pressure and slowly advanced.  The first pressure was 8/5 cmH2O and she  finally reached 17/13 cmH2O but her AHI was reduced to 3.8 apneas per hour of sleep from previously over 44.  Snoring also was alleviated at that higher pressure.  She was placed on a full facemask which I had initially not wanted but he tolerated a small size Simplus fullface mask very well.   Erika [name] mother is actually more thrilled than the patient about the results.  She no longer hears her daughter snoring or have apnea, the machine is very quiet and it has improved her own sleep significantly.  Erika [name] has been very compliant she has used the machine over 4 hours nightly for 26 out of the last 30 days, and she has used it on every of the last 30 days.  Average use of time is 5 hours and 49 minutes, the machine is set at 18/14 cmH2O BiPAP with a background respiratory rate of 10 breaths/min, her residual AHI is 0.0.  There are no more apneas.  She does have some high air leaks but these do not affect her sleep quality or the apnea control.  If troponin once I would change her to a nasal pillow or nasal mask.     REVIEW OF SYSTEMS: Full 14 system review of systems performed and notable only for those listed, all others are neg:  Constitutional: neg  Cardiovascular: neg Ear/Nose/Throat: neg  Skin: neg Eyes: neg Respiratory: neg Gastroitestinal: neg  Hematology/Lymphatic: neg  Endocrine: neg Musculoskeletal:neg Allergy/Immunology: neg Neurological: neg Psychiatric:  neg Sleep : neg   ALLERGIES: Allergies  Allergen Reactions  . 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 ARIPiprazole (ABILIFY) 10 MG tablet Take 1 tablet (10 mg total) by mouth daily. 90 tablet 0  . atorvastatin (LIPITOR) 20 MG tablet Take 20 mg by mouth at bedtime.    . benazepril (LOTENSIN) 10 MG tablet Take 10 mg by mouth daily.    .  cholecalciferol (VITAMIN D) 1000 units tablet Take 1,000 Units by mouth daily.    . Empagliflozin-Metformin HCl ER (SYNJARDY XR) 11-998 MG TB24 Take 0.5 tablets by mouth at bedtime.    . famotidine (PEPCID) 20 MG tablet Take 20 mg by mouth 3 (three) times daily.    . Papaya Enzyme CHEW Chew 1 each by mouth 3 (three) times daily after meals.    . sertraline (ZOLOFT) 100 MG tablet Take 1.5 tablets (150 mg total) by mouth daily. 135 tablet 0  . temazepam (RESTORIL) 15 MG capsule Take 1 capsule (15 mg total) by mouth at bedtime. 90 capsule 0  . ranitidine (ZANTAC) 300 MG capsule Take 300 mg by mouth 2 (two) times daily.     No facility-administered medications prior to visit.     PAST MEDICAL HISTORY: Past Medical History:  Diagnosis Date  . Anxiety   . Depression   . Diabetes (HCC)   . GERD (gastroesophageal reflux disease)   . GERD (gastroesophageal reflux disease)   . Hyperlipemia   . Hyperlipidemia   . Obesity   . OSA treated with BiPAP     PAST SURGICAL HISTORY: Past Surgical History:  Procedure Laterality Date  . ABDOMINAL HYSTERECTOMY    . CHOLECYSTECTOMY    . ESOPHAGOGASTRODUODENOSCOPY (EGD) WITH PROPOFOL N/A 07/07/2016   Procedure: ESOPHAGOGASTRODUODENOSCOPY (EGD) WITH PROPOFOL;  Surgeon: Carman Ching, MD;  Location: [facility];  Service: Endoscopy;  Laterality: N/A;    FAMILY HISTORY: Family History  Problem Relation Age of Onset  . Hypertension Mother   . Hyperlipidemia Mother   . Diabetes Mother   . Diabetes Father   . Hypertension Father     SOCIAL HISTORY: Social History   Socioeconomic History  . Marital status: Single    Spouse name: Not on file  . Number of children: Not on file  . Years of education: Not on file  . Highest education level: Not on file  Occupational History  . Not on file  Social Needs  . Financial resource strain: Not on file  . Food insecurity:    Worry: Not on file    Inability: Not on file  . Transportation needs:     Medical: Not on file    Non-medical: Not on file  Tobacco Use  . Smoking status: Never Smoker  . Smokeless tobacco: Never Used  Substance and Sexual Activity  . Alcohol use: No    Alcohol/week: 0.0 standard drinks  . Drug use: No  . Sexual activity: Not Currently    Birth control/protection: Surgical  Lifestyle  . Physical activity:    Days per week: Not on file    Minutes per session: Not on file  . Stress: Not on file  Relationships  . Social connections:    Talks on phone: Not on file    Gets together: Not on file    Attends religious service: Not on file  Active member of club or organization: Not on file    Attends meetings of clubs or organizations: Not on file    Relationship status: Not on file  . Intimate partner violence:    Fear of current or ex partner: Not on file    Emotionally abused: Not on file    Physically abused: Not on file    Forced sexual activity: Not on file  Other Topics Concern  . Not on file  Social History Narrative   Lives with her mom     PHYSICAL EXAM  Vitals:   11/07/17 1224  BP: 140/90  Pulse: 70  Weight: 268 lb 6.4 oz (121.7 kg)  Height: 5\' 3"  (1.6 m)   Body mass index is 47.54 kg/m.  Generalized: Well developed, in no acute distress  Head: normocephalic and atraumatic,. Oropharynx benign mallopatti5 Neck: Supple, circumference 18 Lungs clear Musculoskeletal: No deformity  Skin no rash or edema Neurological examination   Mentation: Alert oriented to time, place, history taking. Attention span and concentration appropriate. Recent and remote memory intact.  Follows all commands speech and language fluent.   Cranial nerve II-XII: Pupils were equal round reactive to light extraocular movements were full, visual field were full on confrontational test. Facial sensation and strength were normal. hearing was intact to finger rubbing bilaterally. Uvula tongue midline. head turning and shoulder shrug were normal and  symmetric.Tongue protrusion into cheek strength was normal. Motor: normal bulk and tone, full strength in the BUE, BLE,  Sensory: normal and symmetric to light touch,  Coordination: finger-nose-finger, heel-to-shin bilaterally, no dysmetria Gait and Station: Rising up from seated position without assistance, normal stance,  moderate stride, no difficulty with turns  DIAGNOSTIC DATA (LABS, IMAGING, TESTING) - ASSESSMENT AND PLAN  42 y.o. year old female here for follow-up for severe obstructive sleep apnea did not respond to CPAP now on BiPAP with nasal pillows.Data dated 10/08/2017-11/06/2017 shows compliance greater than 4 hours at 90% for 27 days.  Average usage 6 hours 2 minutes set pressure 14-18.  Respiratory rate 10.  AHI 3.7 ESS 4.   CPAP compliance is 90% Continue same settings F/U in 1 year Nilda Riggs, [facility], [facility], APRN  [facility] [address] [place], [place] [address] [phone]

## 2017-11-06 ENCOUNTER — Encounter: Payer: Self-pay | Admitting: Nurse Practitioner

## 2017-11-07 ENCOUNTER — Encounter: Payer: Self-pay | Admitting: Nurse Practitioner

## 2017-11-07 ENCOUNTER — Ambulatory Visit: Payer: Medicaid Other | Admitting: Nurse Practitioner

## 2017-11-07 VITALS — BP 140/90 | HR 70 | Ht 63.0 in | Wt 268.4 lb

## 2017-11-07 DIAGNOSIS — G4733 Obstructive sleep apnea (adult) (pediatric): Secondary | ICD-10-CM

## 2017-11-07 NOTE — Patient Instructions (Signed)
CPAP compliance is 90% Continue same settings F/U in 1 year

## 2017-12-19 ENCOUNTER — Ambulatory Visit (HOSPITAL_COMMUNITY): Payer: Medicaid Other | Admitting: Psychiatry

## 2017-12-19 ENCOUNTER — Encounter (HOSPITAL_COMMUNITY): Payer: Self-pay | Admitting: Psychiatry

## 2017-12-19 VITALS — BP 130/84 | HR 84 | Ht 60.5 in | Wt 266.0 lb

## 2017-12-19 DIAGNOSIS — F200 Paranoid schizophrenia: Secondary | ICD-10-CM

## 2017-12-19 DIAGNOSIS — F411 Generalized anxiety disorder: Secondary | ICD-10-CM | POA: Diagnosis not present

## 2017-12-19 MED ORDER — SERTRALINE HCL 100 MG PO TABS: 150.0000 mg | ORAL_TABLET | Freq: Every day | ORAL | 0 refills | Status: DC

## 2017-12-19 MED ORDER — ARIPIPRAZOLE 10 MG PO TABS: 10.0000 mg | ORAL_TABLET | Freq: Every day | ORAL | 0 refills | Status: DC

## 2017-12-19 MED ORDER — TEMAZEPAM 15 MG PO CAPS: 15.0000 mg | ORAL_CAPSULE | Freq: Every day | ORAL | 0 refills | Status: DC

## 2017-12-19 NOTE — Progress Notes (Signed)
BH MD/PA/NP OP Progress Note  12/19/2017 10:37 AM Erika [name]  MRN:  [id number]  Chief Complaint: I had a good summer.  I am taking my medication.  HPI: Patient came for her follow-up appointment with her mother.  She is compliant with medication and denies any side effects.  She had a good summer.  Since using CPAP she is sleeping much better and she is no longer snoring and feels tired in the morning.  She is trying to keep herself busy.  [facility] a [facility].  Patient denies any paranoia, hallucination, suicidal thoughts or homicidal thought.  She has no concern from her medication.  She has no tremors, shakes or any EPS.  Her appetite is okay.  She is trying to lose weight.  Patient denies drinking alcohol or using any illegal substances.  Her anxiety and nervousness is much better with temazepam which helps her sleep and anxiety.  Visit Diagnosis:    ICD-10-CM   1. Paranoid schizophrenia (HCC) F20.0 temazepam (RESTORIL) 15 MG capsule    ARIPiprazole (ABILIFY) 10 MG tablet    sertraline (ZOLOFT) 100 MG tablet    DISCONTINUED: sertraline (ZOLOFT) 100 MG tablet  2. GAD (generalized anxiety disorder) F41.1 sertraline (ZOLOFT) 100 MG tablet    Past Psychiatric History: Viewed Patient has history of psychiatric illness started in her teens. She is at least 3 psychiatric hospitalization at [facility]. She was diagnosed with schizophrenia.  Most of the time it was precipitated by paranoia and noncompliant with medication.  She was taking multiple psychotropic medication which has been gradually reduced and discontinued after close supervision.  Patient denies any history of suicidal attempt.   Past Medical History:  Past Medical History:  Diagnosis Date  . Anxiety   . Depression   . Diabetes (HCC)   . GERD (gastroesophageal reflux disease)   . GERD (gastroesophageal reflux disease)   . Hyperlipemia   . Hyperlipidemia   . Obesity   . OSA treated  with BiPAP     Past Surgical History:  Procedure Laterality Date  . ABDOMINAL HYSTERECTOMY    . CHOLECYSTECTOMY    . ESOPHAGOGASTRODUODENOSCOPY (EGD) WITH PROPOFOL N/A 07/07/2016   Procedure: ESOPHAGOGASTRODUODENOSCOPY (EGD) WITH PROPOFOL;  Surgeon: Carman Ching, MD;  Location: [facility];  Service: Endoscopy;  Laterality: N/A;    Family Psychiatric History: Viewed  Family History:  Family History  Problem Relation Age of Onset  . Hypertension Mother   . Hyperlipidemia Mother   . Diabetes Mother   . Diabetes Father   . Hypertension Father     Social History:  Social History   Socioeconomic History  . Marital status: Single    Spouse name: Not on file  . Number of children: Not on file  . Years of education: Not on file  . Highest education level: Not on file  Occupational History  . Not on file  Social Needs  . Financial resource strain: Not on file  . Food insecurity:    Worry: Not on file    Inability: Not on file  . Transportation needs:    Medical: Not on file    Non-medical: Not on file  Tobacco Use  . Smoking status: Never Smoker  . Smokeless tobacco: Never Used  Substance and Sexual Activity  . Alcohol use: No    Alcohol/week: 0.0 standard drinks  . Drug use: No  . Sexual activity: Not Currently    Birth control/protection: Surgical  Lifestyle  .  Physical activity:    Days per week: Not on file    Minutes per session: Not on file  . Stress: Not on file  Relationships  . Social connections:    Talks on phone: Not on file    Gets together: Not on file    Attends religious service: Not on file    Active member of club or organization: Not on file    Attends meetings of clubs or organizations: Not on file    Relationship status: Not on file  Other Topics Concern  . Not on file  Social History Narrative   Lives with her mom    Allergies:  Allergies  Allergen Reactions  . 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 ARIPiprazole (ABILIFY) 10 MG tablet Take 1 tablet (10 mg total) by mouth daily. 90 tablet 0  . atorvastatin (LIPITOR) 20 MG tablet Take 20 mg by mouth at bedtime.    . benazepril (LOTENSIN) 10 MG tablet Take 10 mg by mouth daily.    . cholecalciferol (VITAMIN D) 1000 units tablet Take 1,000 Units by mouth daily.    . Empagliflozin-Metformin HCl ER (SYNJARDY XR) 11-998 MG TB24 Take 0.5 tablets by mouth at bedtime.    . famotidine (PEPCID) 20 MG tablet Take 20 mg by mouth 3 (three) times daily.    . Papaya Enzyme CHEW Chew 1 each by mouth 3 (three) times daily after meals.    . sertraline (ZOLOFT) 100 MG tablet Take 1.5 tablets (150 mg total) by mouth daily. 135 tablet 0  . temazepam (RESTORIL) 15 MG capsule Take 1 capsule (15 mg total) by mouth at bedtime. 90 capsule 0   No current facility-administered medications for this visit.      Musculoskeletal: Strength & Muscle Tone: within normal limits Gait & Station: normal Patient leans: N/A  Psychiatric Specialty Exam: ROS  Blood pressure 130/84, pulse 84, height 5' 0.5" (1.537 m), weight 266 lb (120.7 kg), SpO2 93 %.Body mass index is 51.09 kg/m.  General Appearance: Casual and over weight  Eye Contact:  Fair  Speech:  Slow  Volume:  Normal  Mood:  Anxious  Affect:  Congruent  Thought Process:  Goal Directed  Orientation:  Full (Time, Place, and Person)  Thought Content: Logical   Suicidal Thoughts:  No  Homicidal Thoughts:  No  Memory:  Immediate;   Fair  Judgement:  Good  Insight:   Good  Psychomotor Activity:  Decreased  Concentration:  Concentration: Fair and Attention Span: Fair  Recall:  [employer] of Knowledge: Good  Language: Good  Akathisia:  No  Handed:  Right  AIMS (if indicated): not done  Assets:  Communication Skills Desire for Improvement Housing Resilience Social Support  ADL's:  Intact  Cognition: WNL  Sleep:  Good   Screenings:   Assessment and Plan: Paranoid schizophrenia.  Generalized anxiety disorder.  Patient is a stable on her current medication.  I will continue temazepam 50 mg at bedtime, Zoloft 150 mg daily and Abilify 10 mg daily.  Discussed  medication side effects and benefits.  Recommended to call us back if she has any question, concern if she feels worsening of the symptoms.  Follow-up in 3 months.   Cleotis Nipper, MD 12/19/2017, 10:37 AM

## 2018-03-21 ENCOUNTER — Encounter (HOSPITAL_COMMUNITY): Payer: Self-pay | Admitting: Psychiatry

## 2018-03-21 ENCOUNTER — Ambulatory Visit (HOSPITAL_COMMUNITY): Payer: Medicaid Other | Admitting: Psychiatry

## 2018-03-21 DIAGNOSIS — F411 Generalized anxiety disorder: Secondary | ICD-10-CM | POA: Diagnosis not present

## 2018-03-21 DIAGNOSIS — F200 Paranoid schizophrenia: Secondary | ICD-10-CM | POA: Diagnosis not present

## 2018-03-21 MED ORDER — TEMAZEPAM 15 MG PO CAPS: 15.0000 mg | ORAL_CAPSULE | Freq: Every day | ORAL | 0 refills | Status: DC

## 2018-03-21 MED ORDER — ARIPIPRAZOLE 10 MG PO TABS: 10.0000 mg | ORAL_TABLET | Freq: Every day | ORAL | 0 refills | Status: DC

## 2018-03-21 MED ORDER — SERTRALINE HCL 100 MG PO TABS: 150.0000 mg | ORAL_TABLET | Freq: Every day | ORAL | 0 refills | Status: DC

## 2018-03-21 NOTE — Progress Notes (Signed)
BH MD/PA/NP OP Progress Note  03/21/2018 3:20 PM Erika [name]  MRN:  [id number]  Chief Complaint: I had a good Christmas.  HPI: Patient came for her follow-up appointment with her mother.  She had a good Christmas.  She enjoys holidays.  She is taking the medication as prescribed.  She has no side effects or any tremors or shakes.  Recently she seen her primary care physician and had blood work.  Her hemoglobin A1c is 6.7 which is stable.  Her mother endorsed that she snacks on and off and sometimes does not control her diet.  Her chemistry otherwise normal.  Her lipid panel was also normal.  Blood work was drawn on November 21.  Patient denies any paranoia, hallucination, anger, crying spells or any feeling of hopelessness or worthlessness.  She continues to [facility] times a [facility].  She like to continue her current medication.  Visit Diagnosis:    ICD-10-CM   1. Paranoid schizophrenia (HCC) F20.0 ARIPiprazole (ABILIFY) 10 MG tablet    sertraline (ZOLOFT) 100 MG tablet    temazepam (RESTORIL) 15 MG capsule  2. GAD (generalized anxiety disorder) F41.1 sertraline (ZOLOFT) 100 MG tablet    Past Psychiatric History: Reviewed. History of psychiatric illness since teens. H/O at least 3 psychiatric hospitalization at [facility]. Diagnosed with schizophrenia.  Most of the time it was precipitated by paranoia and noncompliant with medication.  She was taking multiple psychotropic medication which has been gradually reduced and discontinued after close supervision.  Patient denies any history of suicidal attempt.   Past Medical History:  Past Medical History:  Diagnosis Date  . Anxiety   . Depression   . Diabetes (HCC)   . GERD (gastroesophageal reflux disease)   . GERD (gastroesophageal reflux disease)   . Hyperlipemia   . Hyperlipidemia   . Obesity   . OSA treated with BiPAP     Past Surgical History:  Procedure Laterality Date  . ABDOMINAL HYSTERECTOMY    .  CHOLECYSTECTOMY    . ESOPHAGOGASTRODUODENOSCOPY (EGD) WITH PROPOFOL N/A 07/07/2016   Procedure: ESOPHAGOGASTRODUODENOSCOPY (EGD) WITH PROPOFOL;  Surgeon: Carman Ching, MD;  Location: [facility];  Service: Endoscopy;  Laterality: N/A;    Family Psychiatric History: Reviewed.  Family History:  Family History  Problem Relation Age of Onset  . Hypertension Mother   . Hyperlipidemia Mother   . Diabetes Mother   . Diabetes Father   . Hypertension Father     Social History:  Social History   Socioeconomic History  . Marital status: Single    Spouse name: Not on file  . Number of children: Not on file  . Years of education: Not on file  . Highest education level: Not on file  Occupational History  . Not on file  Social Needs  . Financial resource strain: Not on file  . Food insecurity:    Worry: Not on file    Inability: Not on file  . Transportation needs:    Medical: Not on file    Non-medical: Not on file  Tobacco Use  . Smoking status: Never Smoker  . Smokeless tobacco: Never Used  Substance and Sexual Activity  . Alcohol use: No    Alcohol/week: 0.0 standard drinks  . Drug use: No  . Sexual activity: Not Currently    Birth control/protection: Surgical  Lifestyle  . Physical activity:    Days per week: Not on file    Minutes per session: Not on file  .  Stress: Not on file  Relationships  . Social connections:    Talks on phone: Not on file    Gets together: Not on file    Attends religious service: Not on file    Active member of club or organization: Not on file    Attends meetings of clubs or organizations: Not on file    Relationship status: Not on file  Other Topics Concern  . Not on file  Social History Narrative   Lives with her mom    Allergies:  Allergies  Allergen Reactions  . Haldol [Haloperidol Decanoate] Other (See Comments)    NMS (neuroleptic malignant syndrome) , 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      Metabolic Disorder Labs: No results found for:  HGBA1C, MPG No results found for: PROLACTIN No results found for: CHOL, TRIG, HDL, CHOLHDL, VLDL, LDLCALC No results found for: TSH  Therapeutic Level Labs: No results found for: LITHIUM No results found for: VALPROATE No components found for:  CBMZ  Current Medications: Current Outpatient Medications  Medication Sig Dispense Refill  . ARIPiprazole (ABILIFY) 10 MG tablet Take 1 tablet (10 mg total) by mouth daily. 90 tablet 0  . atorvastatin (LIPITOR) 20 MG tablet Take 20 mg by mouth at bedtime.    . benazepril (LOTENSIN) 10 MG tablet Take 10 mg by mouth daily.    . cholecalciferol (VITAMIN D) 1000 units tablet Take 1,000 Units by mouth daily.    . Empagliflozin-Metformin HCl ER (SYNJARDY XR) 11-998 MG TB24 Take 0.5 tablets by mouth at bedtime.    . famotidine (PEPCID) 20 MG tablet Take 20 mg by mouth 3 (three) times daily.    . Papaya Enzyme CHEW Chew 1 each by mouth 3 (three) times daily after meals.    . sertraline (ZOLOFT) 100 MG tablet Take 1.5 tablets (150 mg total) by mouth daily. 135 tablet 0  . temazepam (RESTORIL) 15 MG capsule Take 1 capsule (15 mg total) by mouth at bedtime. 90 capsule 0   No current facility-administered medications for this visit.      Musculoskeletal: Strength & Muscle Tone: within normal limits Gait & Station: normal Patient leans: N/A  Psychiatric Specialty Exam: ROS  Blood pressure (!) 123/48, pulse 73, height 5' 0.05" (1.525 m), weight 270 lb (122.5 kg).Body mass index is 52.64 kg/m.  General Appearance: Casual and Overweight  Eye Contact:  Fair  Speech:  Slow  Volume:  Decreased  Mood:  Euthymic  Affect:  Congruent  Thought Process:  Descriptions of Associations: Intact  Orientation:  Full (Time, Place, and Person)  Thought Content: Logical   Suicidal Thoughts:  No  Homicidal Thoughts:  No  Memory:  Immediate;   Good Recent;   Good Remote;   Fair  Judgement:  Good  Insight:  Good  Psychomotor Activity:  Decreased   Concentration:  Concentration: Fair and Attention Span: Fair  Recall:  [employer]  Fund of Knowledge: Good  Language: Good  Akathisia:  No  Handed:  Right  AIMS (if indicated): not done  Assets:  Communication Skills Desire for Improvement Housing Social Support  ADL's:  Intact  Cognition: WNL  Sleep:  Good   Screenings:   Assessment and Plan: Paranoid schizophrenia.  Generalized anxiety disorder.  Patient is a stable on her current medication.  Encourage to watch her calorie intake and do regular exercise.  She gained 4 pounds since the last visit.  Encourage healthy lifestyle.  Continue temazepam 15 mg at bedtime, Zoloft 150 mg daily and  Abilify 10 mg daily.  Patient is not interested in therapy.  Recommended to call us back if she has any question, concern or if she feels worsening of the symptoms.  Follow-up in 3 months.   Cleotis Nipper, MD 03/21/2018, 3:20 PM

## 2018-06-20 ENCOUNTER — Other Ambulatory Visit: Payer: Self-pay

## 2018-06-20 ENCOUNTER — Encounter (HOSPITAL_COMMUNITY): Payer: Self-pay | Admitting: Psychiatry

## 2018-06-20 ENCOUNTER — Ambulatory Visit (INDEPENDENT_AMBULATORY_CARE_PROVIDER_SITE_OTHER): Payer: Medicaid Other | Admitting: Psychiatry

## 2018-06-20 DIAGNOSIS — F200 Paranoid schizophrenia: Secondary | ICD-10-CM

## 2018-06-20 DIAGNOSIS — F411 Generalized anxiety disorder: Secondary | ICD-10-CM | POA: Diagnosis not present

## 2018-06-20 MED ORDER — SERTRALINE HCL 100 MG PO TABS: 150.0000 mg | ORAL_TABLET | Freq: Every day | ORAL | 0 refills | Status: DC

## 2018-06-20 MED ORDER — TEMAZEPAM 15 MG PO CAPS: 15.0000 mg | ORAL_CAPSULE | Freq: Every day | ORAL | 0 refills | Status: DC

## 2018-06-20 MED ORDER — ARIPIPRAZOLE 10 MG PO TABS: 10.0000 mg | ORAL_TABLET | Freq: Every day | ORAL | 0 refills | Status: DC

## 2018-06-20 NOTE — Progress Notes (Signed)
Virtual Visit via Telephone Note  I connected with Erika [name] on 06/20/18 at  3:00 PM EDT by telephone and verified that I am speaking with the correct person using two identifiers.   I discussed the limitations, risks, security and privacy concerns of performing an evaluation and management service by telephone and the availability of in person appointments. I also discussed with the patient that there may be a patient responsible charge related to this service. The patient expressed understanding and agreed to proceed.   History of Present Illness: Patient was evaluated through phone session.  Her mother was also present.  Patient is doing well on her current medication.  She is not going to [facility] where she used to go 5 times a week but her mother is keeping her busy at home.  As per mother she lost another 8 pounds since the last visit.  She has a blood work in March at Dr. Nunzio Cory office and her hemoglobin A1c is 6.7.  Which is unchanged from the past.  She is sleeping good.  She reported no side effects of the medication.  She denies any hallucination, paranoia or any irritability.  She reported medicine is working and she does not want to change the dosage.  Her sleep is good.  She reported increased energy in recent weeks since she lost weight.  Patient denies drinking or using any illegal substances.   Past Psychiatric History: Reviewed. H/O psychiatric illness since teens. H/O at least 3 psychiatric hospitalization at [facility]. Diagnosed with schizophrenia. Most of the time it was precipitated by paranoia and noncompliant with medication. H/O ttaking multiple psychotropic medication which gradually reduced and discontinued after close supervision. No h/o suicidal attempt.    Observations/Objective: Mental status examination done on the phone.  Patient described her mood okay.  Her speech is slow, soft with decrease in volume and tone.  Her attention and concentration is fair.  She  usually answer yes and no but relevant.  She denies any auditory or visual hallucination.  She denies any active or passive suicidal thoughts or homicidal thoughts.  She reported no tremors shakes or any EPS.  There were no grandiosity or paranoia.  She is alert and oriented x3.  Her fund of knowledge is fair.  Her cognition is fair.  Her insight judgment is okay.  Assessment and Plan: Schizophrenia chronic paranoid type.  Generalized anxiety disorder.  Patient is a stable on her medication.  She feels better since last weight in recent weeks.  I will continue temazepam 15 mg at bedtime, Zoloft current 50 mg daily and Abilify 10 mg daily.  Discussed medication side effects and benefits.  Encourage healthy lifestyle and watch her calorie intake and walk 10 to 15 minutes 3-4 times a week.  Recommended to call us back if she has any question or any concern.  Follow-up in 3 months.  Follow Up Instructions:    I discussed the assessment and treatment plan with the patient. The patient was provided an opportunity to ask questions and all were answered. The patient agreed with the plan and demonstrated an understanding of the instructions.   The patient was advised to call back or seek an in-person evaluation if the symptoms worsen or if the condition fails to improve as anticipated.  I provided 20 minutes of non-face-to-face time during this encounter.   Cleotis Nipper, MD

## 2018-09-21 ENCOUNTER — Encounter (HOSPITAL_COMMUNITY): Payer: Self-pay | Admitting: Psychiatry

## 2018-09-21 ENCOUNTER — Other Ambulatory Visit: Payer: Self-pay

## 2018-09-21 ENCOUNTER — Ambulatory Visit (INDEPENDENT_AMBULATORY_CARE_PROVIDER_SITE_OTHER): Payer: Medicaid Other | Admitting: Psychiatry

## 2018-09-21 DIAGNOSIS — F411 Generalized anxiety disorder: Secondary | ICD-10-CM

## 2018-09-21 DIAGNOSIS — F200 Paranoid schizophrenia: Secondary | ICD-10-CM | POA: Diagnosis not present

## 2018-09-21 MED ORDER — ARIPIPRAZOLE 10 MG PO TABS: 10.0000 mg | ORAL_TABLET | Freq: Every day | ORAL | 0 refills | Status: DC

## 2018-09-21 MED ORDER — TEMAZEPAM 15 MG PO CAPS: 15.0000 mg | ORAL_CAPSULE | Freq: Every day | ORAL | 0 refills | Status: DC

## 2018-09-21 MED ORDER — SERTRALINE HCL 100 MG PO TABS: 150.0000 mg | ORAL_TABLET | Freq: Every day | ORAL | 0 refills | Status: DC

## 2018-09-21 NOTE — Progress Notes (Signed)
Virtual Visit via Telephone Note  I connected with Erika [name] on 09/21/18 at  4:00 PM EDT by telephone and verified that I am speaking with the correct person using two identifiers.   I discussed the limitations, risks, security and privacy concerns of performing an evaluation and management service by telephone and the availability of in person appointments. I also discussed with the patient that there may be a patient responsible charge related to this service. The patient expressed understanding and agreed to proceed.   History of Present Illness: Patient was evaluated through phone session.  Her mother was also present there.  Patient is doing very well on her current medication.  She is trying to keep herself busy by walking her dog since she is not able to work at [employer] due to [employer].  Recently she saw her primary care physician Dr. Mellody Drown.  Her hemoglobin A1c was 7.5 and she was not happy.  Her medicines were changed and now she is taking a new medication to help her diabetes.  Overall she describes her mood is good.  She is sleeping good.  She denies any paranoia, hallucination, irritability or any anger.  Her sleep is good.  She has no tremors or shakes.  Her energy level is good.  She is trying to lose weight and watching her calorie intakes.  Past Psychiatric History:Reviewed. [place]. H/Oat least 3 psychiatric hospitalization at [facility]. Diagnosed with schizophrenia. Most of the time it was precipitated by paranoia and noncompliant with medication. H/O ttaking multiple psychotropic medication which gradually reduced and discontinued after close supervision. No h/o suicidal attempt.    Psychiatric Specialty Exam: Physical Exam  ROS  There were no vitals taken for this visit.There is no height or weight on file to calculate BMI.  General Appearance: NA  Eye Contact:  NA  Speech:  Clear and Coherent and Slow  Volume:  Normal  Mood:  Euthymic   Affect:  NA  Thought Process:  Goal Directed  Orientation:  Full (Time, Place, and Person)  Thought Content:  Logical  Suicidal Thoughts:  No  Homicidal Thoughts:  No  Memory:  Immediate;   Good Recent;   Good Remote;   Good  Judgement:  Good  Insight:  Good  Psychomotor Activity:  NA  Concentration:  Concentration: Fair and Attention Span: Fair  Recall:  [employer] of Knowledge:  Fair  Language:  Good  Akathisia:  No  Handed:  Right  AIMS (if indicated):     Assets:  Communication Skills Desire for Improvement Housing Resilience Social Support  ADL's:  Intact  Cognition:  WNL  Sleep:         Assessment and Plan: Schizophrenia chronic paranoid type.  Generalized anxiety disorder.  Patient is doing better on her current medication.  I will continue temazepam 15 mg at bedtime, Zoloft 50 mg daily and Abilify 10 mg daily.  Encourage healthy lifestyle and watch her calorie intake.  Recommended to call us back if she has any question or any concern.  Follow-up in 3 months.  Follow Up Instructions:    I discussed the assessment and treatment plan with the patient. The patient was provided an opportunity to ask questions and all were answered. The patient agreed with the plan and demonstrated an understanding of the instructions.   The patient was advised to call back or seek an in-person evaluation if the symptoms worsen or if the condition fails to improve as anticipated.  I  provided 15 minutes of non-face-to-face time during this encounter.   Kathlee Nations, MD

## 2018-11-08 ENCOUNTER — Encounter: Payer: Self-pay | Admitting: Neurology

## 2018-11-09 ENCOUNTER — Ambulatory Visit: Payer: Medicaid Other | Admitting: Neurology

## 2018-11-09 ENCOUNTER — Encounter: Payer: Self-pay | Admitting: Neurology

## 2018-11-09 ENCOUNTER — Other Ambulatory Visit: Payer: Self-pay

## 2018-11-09 VITALS — BP 139/85 | HR 82 | Temp 97.3°F | Ht 64.0 in | Wt 270.0 lb

## 2018-11-09 DIAGNOSIS — G4733 Obstructive sleep apnea (adult) (pediatric): Secondary | ICD-10-CM | POA: Diagnosis not present

## 2018-11-09 DIAGNOSIS — M2619 Other specified anomalies of jaw-cranial base relationship: Secondary | ICD-10-CM

## 2018-11-09 NOTE — Progress Notes (Signed)
SLEEP MEDICINE CLINIC   Provider:  Melvyn Novas, MD  Primary Care Physician:  Ralene Ok, MD   Referring Provider: [facility] - Dr. Rosemary Holms   Chief Complaint  Patient presents with  . Follow-up    RM 11 with mother. Last seen 11/07/2017 by CM. Pt now has bite guard from dentist. Started wearing this 10/30/2018. No issues with CPAP machine.     11-09-2018 SHERVON KERWIN is a 43 y.o. female with BiPAP treatment and MRDD , seen here as in a referral/ revisit  from Dr. Verl Dicker Partner , Dr. Rosemary Holms.  Patient was last seen 1 year ago, by Darrol Angel, NP. In the meantime she had a bite guard made, which helped the patient with bruxism prevention but also seems to have had a positive effect on her breathing in conjunction with BiPAP 1 of many  risk factors has been her weight and her current BMI is 46.35.  However she has been a very compliant user of BiPAP for the treatment of obstructive sleep apnea and has no issues with the machine. Dr. Hulen Skains is her dentist, Miss Aradia  has continued to be highly compliant she has used the machine 29 of the last 30 days including 08 November 2018.   Average user time is 6 hours and 27 minutes.  Her inspiratory pressure is 18 expiratory pressure 14 cmH2O and she uses a background SVT rate of 10 bpm so rate 10 breaths/min.  Residual AHI is 4.9 which is fine and she does have some major air leaks but the have been addressed with a new mask.   The AHI varies greatly from night to night.  The respiratory rate however states constant.  I do not need to make any adjustments in settings.    Interval history from 04 May 2017, I have the pleasure of meeting with Ms. Fesler and her mother today for the first time since her sleep studies were performed.  The patient underwent a split-night polysomnography on 24 December 2016 and the study confirmed that he has a high degree of apnea with an AHI of 44.4/h of sleep.  This means that  she has an apnea about every 80 seconds.  She spent all night in supine position so we could not determine if there was a positional component, and she did not reach REM sleep either.  She did not have significant low oxygen saturations none of prolonged duration however.  She was started on CPAP first was 5 cmH2O and was step-by-step titrated to 15 CPAP caused central apneas to emerge and did not reduce the overall apnea index.  The patient was therefore changed briefly to BiPAP but at that time the night was almost over.  Pressures of 15/10 centimeter and 17/12 centimeters were tried.  We asked the patient to return for a full night BiPAP titration based on these limited results.  She also developed some periodic limb movements at night but were not present in her first study.  She returned for a full night titration on 30 November, BiPAP was now initiated at a low pressure and slowly advanced.  The first pressure was 8/5 cmH2O and she finally reached 17/13 cmH2O but her AHI was reduced to 3.8 apneas per hour of sleep from previously over 44.  Snoring also was alleviated at that higher pressure.  She was placed on a full facemask which I had initially not wanted but he tolerated a small size Simplus fullface mask very well.  Ms. Blair DolphinCampbell's mother is actually more thrilled than the patient about the results.  She no longer hears her daughter snoring or have apnea, the machine is very quiet and it has improved her own sleep significantly.  Sela HuaCarmona has been very compliant she has used the machine over 4 hours nightly for 26 out of the last 30 days, and she has used it on every of the last 30 days.  Average use of time is 5 hours and 49 minutes, the machine is set at 18/14 cmH2O BiPAP with a background respiratory rate of 10 breaths/min, her residual AHI is 0.0.  There are no more apneas.  She does have some high air leaks but these do not affect her sleep quality or the apnea control.  If troponin once I would  change her to a nasal pillow or nasal mask.     Consult : She is according to her mother's observation snoring very loudly, for many years. She gained weight over the last 5 years, partially attributed to depression. The patient has a long psychiatric history, has a learning disability( Dr. Lolly MustacheArfeen)  She Gained weight over the last years due to psychotropic medication for the treatment of depression, she also has developed hypertension, and she was witnessed to have apnea after a medical procedure, but she cannot remember what kind of procedures this was. Sleep habits are as follows: Ms. Erika [name] usually watches TV for the last hours of the day before she goes to bed. Her bedtime is around 10 PM, and she retreats to her bedroom at this time. Her bedroom is cool quiet and dark. She sleeps on her side. She avoids the right side because of an ophthalmological condition. She wears a mask for eye protection to sleep. She wakes up frequently to go to the bathroom at night at least 2 times. She rises in the morning and 5:15. Usually she has got 6 hours of sleep by that time. She feels usually rested and restored in the morning but also reports a dry mouth. She likes to chew gum to give her relief from the dry mouth. She also drinks a lot of water during the day, but has to be reminded.   Sleep medical history and family sleep history: There is no other known family history of sleep apnea, but Ms. Erika [name] has been observed reading irregular in her sleep. She has diabetes, HTN, super obesity. Abnormal EKG- "flutter " .  She is on a sleep walker, has no history of night terrors or enuresis. She never underwent tonsillectomy and has no history of neck surgery or trauma.   Social history: Ms. Erika [name] lives with her parents, she is usually the first one in bed and rises without resistance and the morning. She goes to an adult enrichment center, and enjoys it. She does not use tobacco in any form, she does not  drink alcohol, caffeine use she does drink coffee, soda and ice tea. She does not drink more than 1 or 2 cups of caffeinated beverage a day. Mostly soda.  Review of Systems: Out of a complete 14 system review, the patient complains of only the following symptoms, and all other reviewed systems are negative.  How likely are you to doze in the following situations: 0 = not likely, 1 = slight chance, 2 = moderate chance, 3 = high chance  Sitting and Reading? Watching Television? Sitting inactive in a public place (theater or meeting)? Lying down in the afternoon when circumstances permit? Sitting and  talking to someone? Sitting quietly after lunch without alcohol? In a car, while stopped for a few minutes in traffic? As a passenger in a car for an hour without a break?  Total = 5/ 24     Retrognathia. Significantly recessed mandibula   Social History   Socioeconomic History  . Marital status: Single    Spouse name: Not on file  . Number of children: Not on file  . Years of education: Not on file  . Highest education level: Not on file  Occupational History  . Not on file  Social Needs  . Financial resource strain: Not on file  . Food insecurity    Worry: Not on file    Inability: Not on file  . Transportation needs    Medical: Not on file    Non-medical: Not on file  Tobacco Use  . Smoking status: Never Smoker  . Smokeless tobacco: Never Used  Substance and Sexual Activity  . Alcohol use: No    Alcohol/week: 0.0 standard drinks  . Drug use: No  . Sexual activity: Not Currently    Birth control/protection: Surgical  Lifestyle  . Physical activity    Days per week: Not on file    Minutes per session: Not on file  . Stress: Not on file  Relationships  . Social Musician on phone: Not on file    Gets together: Not on file    Attends religious service: Not on file    Active member of club or organization: Not on file    Attends meetings of clubs or  organizations: Not on file    Relationship status: Not on file  . Intimate partner violence    Fear of current or ex partner: Not on file    Emotionally abused: Not on file    Physically abused: Not on file    Forced sexual activity: Not on file  Other Topics Concern  . Not on file  Social History Narrative   Lives with her mom    Family History  Problem Relation Age of Onset  . Hypertension Mother   . Hyperlipidemia Mother   . Diabetes Mother   . Diabetes Father   . Hypertension Father     Past Medical History:  Diagnosis Date  . Anxiety   . Depression   . Diabetes (HCC)   . GERD (gastroesophageal reflux disease)   . GERD (gastroesophageal reflux disease)   . Hyperlipemia   . Hyperlipidemia   . Obesity   . OSA treated with BiPAP     Past Surgical History:  Procedure Laterality Date  . ABDOMINAL HYSTERECTOMY    . CHOLECYSTECTOMY    . ESOPHAGOGASTRODUODENOSCOPY (EGD) WITH PROPOFOL N/A 07/07/2016   Procedure: ESOPHAGOGASTRODUODENOSCOPY (EGD) WITH PROPOFOL;  Surgeon: Carman Ching, MD;  Location: [facility];  Service: Endoscopy;  Laterality: N/A;    Current Outpatient Medications  Medication Sig Dispense Refill  . Accu-Chek FastClix Lancets MISC USE TO CHECK GLUCOSE TWICE DAILY    . ACCU-CHEK GUIDE test strip USE 1 TEST STRIP TO TEST BLOOD GLUCOSE ONCE DAILY AS DIRECTED    . ARIPiprazole (ABILIFY) 10 MG tablet Take 1 tablet (10 mg total) by mouth daily. 90 tablet 0  . atorvastatin (LIPITOR) 20 MG tablet Take 20 mg by mouth at bedtime.    . benazepril (LOTENSIN) 10 MG tablet Take 10 mg by mouth daily.    . cholecalciferol (VITAMIN D) 1000 units tablet Take 1,000 Units by mouth  daily.    . Ertugliflozin-metFORMIN HCl (SEGLUROMET) 7.06-998 MG TABS Take 1 tablet by mouth daily.    . famotidine (PEPCID) 20 MG tablet Take 20 mg by mouth 3 (three) times daily.    Marland Kitchen. ketoconazole (NIZORAL) 2 % cream APPLY CREAM TOPICALLY TO AFFECTED AREA ONCE DAILY    . Papaya Enzyme CHEW  Chew 1 each by mouth 3 (three) times daily after meals.    . sertraline (ZOLOFT) 100 MG tablet Take 1.5 tablets (150 mg total) by mouth daily. 135 tablet 0  . temazepam (RESTORIL) 15 MG capsule Take 1 capsule (15 mg total) by mouth at bedtime. 90 capsule 0   No current facility-administered medications for this visit.     Allergies as of 11/09/2018 - Review Complete 11/09/2018  Allergen Reaction Noted  . Haldol [haloperidol decanoate] Other (See Comments) 02/03/2011    Vitals: BP 139/85 (BP Location: Left Arm, Patient Position: Sitting, Cuff Size: Normal)   Pulse 82   Temp (!) 97.3 F (36.3 C)   Ht 5\' 4"  (1.626 m)   Wt 270 lb (122.5 kg)   SpO2 97%   BMI 46.35 kg/m  Last Weight:  Wt Readings from Last 1 Encounters:  11/09/18 270 lb (122.5 kg)   ZOX:WRUEBMI:Body mass index is 46.35 kg/m.     Last Height:   Ht Readings from Last 1 Encounters:  11/09/18 5\' 4"  (1.626 m)    Physical exam:  General: The patient is awake, alert and appears not in acute distress. The patient is well groomed. Head: Normocephalic, atraumatic. Neck is supple. Mallampati 5 ,  neck circumference:18". Nasal airflow restrcicted, TMJ is not  evident . 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  Cardiovascular:  Regular rate and rhythm , without  murmurs or carotid bruit, and without distended neck veins. Respiratory: Lungs are clear to auscultation. Skin:  Without evidence of edema, or rash Trunk: BMI is 46. The patient's posture is stooped.   Neurologic exam : The patient is awake and alert, oriented to place and time.   Memory subjective described as intact.  Attention span & concentration ability appears normal.  Speech is fluent,  without dysarthria, dysphonia or aphasia.  Mood and affect are appropriate.  Cranial nerves: Pupils are equal and briskly reactive to light. Funduscopic exam without evidence of pallor or edema.  Extraocular movements  in vertical and horizontal planes intact and without  nystagmus. Visual fields by finger perimetry are intact. Hearing to finger rub intact.  Facial sensation intact to fine touch.  Facial motor strength is symmetric and tongue and uvula move midline. Shoulder shrug was symmetrical.   Motor exam:  Normal tone, muscle bulk and symmetric strength in all extremities. Sensory:  Fine touch, pinprick and vibration were normal. Coordination: Finger-to-nose maneuver  normal without evidence of ataxia, dysmetria or tremor. Gait and station: Patient walks without assistive device and is able unassisted to climb up to the exam table. Strength within normal limits.Stance is stable and normal.   Turns with 4 Steps. Deep tendon reflexes: in the  upper and lower extremities are symmetric and intact. Babinski maneuver response is  downgoing.    Assessment:  After physical and neurologic examination, review of laboratory studies,  Personal review of imaging studies, reports of other /same  Imaging studies, results of polysomnography and / or neurophysiology testing and pre-existing records as far as provided in visit., my assessment is   1) severe obstructive sleep apnea- not responding to CPAP on BIPAP 18/14 cm  with use of a FFM and high air leak, but AHI is 4.7 /hr. SIMPLUS FFM in small was used by technologist. I like for the patient to have an alternative Nasal pillow.  She now wakes up ready for work, is refreshed and restored and has helped her mood.   2) Mrs. Martelli's weight gain seem to be correlated to the use of psychiatric medications and as these have over the last year been tapered she was also able to lose weight. She still is considered morbidly obese. She may benefit from medical weight management.  3) She also has a  high-grade Mallampati and retrognathia making her prone to have apnea independent of weight. This constellation makes response to FFM more difficult, as it applies pressure on the diminutive lower jaw, pushing back her chin.   The  patient was advised of the nature of the diagnosed disorder , the treatment options and the  risks for general health and wellness arising from not treating the condition.   I spent more than 15 minutes of face to face time with the patient and her mother . I have reviewed the records of [facility].   Greater than 50% of time was spent in counseling and coordination of care. We have discussed the diagnosis and differential and I answered the patient's questions.    Plan:  Treatment plan and additional workup :   Continue BiPAP therapy,  I wil offer an alternative nasal mask or nasal pillow for her -  DME is  [facility] in 12 month -  [place], GO !   Larey Seat, MD 4/31/5400, 8:67 PM  Certified in Neurology by ABPN Certified in [place] by [facility] [address] [place], [place] [address]

## 2018-12-18 ENCOUNTER — Other Ambulatory Visit: Payer: Self-pay

## 2018-12-18 ENCOUNTER — Encounter (HOSPITAL_COMMUNITY): Payer: Self-pay | Admitting: Psychiatry

## 2018-12-18 ENCOUNTER — Ambulatory Visit (INDEPENDENT_AMBULATORY_CARE_PROVIDER_SITE_OTHER): Payer: Medicaid Other | Admitting: Psychiatry

## 2018-12-18 DIAGNOSIS — F411 Generalized anxiety disorder: Secondary | ICD-10-CM

## 2018-12-18 DIAGNOSIS — F200 Paranoid schizophrenia: Secondary | ICD-10-CM | POA: Diagnosis not present

## 2018-12-18 MED ORDER — SERTRALINE HCL 100 MG PO TABS: 150.0000 mg | ORAL_TABLET | Freq: Every day | ORAL | 0 refills | Status: DC

## 2018-12-18 MED ORDER — ARIPIPRAZOLE 10 MG PO TABS: 10.0000 mg | ORAL_TABLET | Freq: Every day | ORAL | 0 refills | Status: DC

## 2018-12-18 MED ORDER — TEMAZEPAM 15 MG PO CAPS: 15.0000 mg | ORAL_CAPSULE | Freq: Every day | ORAL | 0 refills | Status: DC

## 2018-12-18 NOTE — Progress Notes (Signed)
Virtual Visit via Telephone Note  I connected with Erika [name] on 12/18/18 at  4:00 PM EDT by telephone and verified that I am speaking with the correct person using two identifiers.   I discussed the limitations, risks, security and privacy concerns of performing an evaluation and management service by telephone and the availability of in person appointments. I also discussed with the patient that there may be a patient responsible charge related to this service. The patient expressed understanding and agreed to proceed.   History of Present Illness: Patient was evaluated by phone session.  Her mother was also closed by.  Patient and her mother mentioned that she is doing very well and taking her medication.  She is happy as she started working 3 days a week.  She is needing jewelry and it is keeping her busy.  She denies any paranoia, hallucination or any suicidal thoughts.  She denies any anger or any crying spells.  Recently she see her PCP Dr. Mellody Drown and her hemoglobin A1c is dropped from 7.5-7.  However her weight remains unchanged.  Patient like to keep her current medication.  She is sleeping good.  She has no tremors or any shakes.   Past Psychiatric History:Reviewed. H/Opsychiatric illness[place]eteens. H/Oat least 3 psychiatric hospitalization [place].Diagnosed with schizophrenia. Most of the time it was precipitated by paranoia and noncompliant with medication. H/O ttaking multiple psychotropic medication which gradually reduced and discontinued after close supervision. No h/osuicidal attempt.     Psychiatric Specialty Exam: Physical Exam  ROS  There were no vitals taken for this visit.There is no height or weight on file to calculate BMI.  General Appearance: NA  Eye Contact:  NA  Speech:  Slow  Volume:  Normal  Mood:  Euthymic  Affect:  NA  Thought Process:  Goal Directed  Orientation:  Full (Time, Place, and Person)  Thought Content:  Logical  Suicidal  Thoughts:  No  Homicidal Thoughts:  No  Memory:  Immediate;   Fair Recent;   Fair Remote;   Fair  Judgement:  Fair  Insight:  Present  Psychomotor Activity:  NA  Concentration:  Concentration: Fair and Attention Span: Fair  Recall:  [employer] of Knowledge:  Good  Language:  Fair  Akathisia:  No  Handed:  Right  AIMS (if indicated):     Assets:  Communication Skills Desire for Improvement Housing Resilience Social Support  ADL's:  Intact  Cognition:  WNL  Sleep:   ok      Assessment and Plan: Schizophrenia chronic paranoid type.  Generalized anxiety disorder.  Patient is a stable on her current medication.  Discussed medication side effects and benefits.  Continue temazepam 50 mg at bedtime, Zoloft 150 mg daily and Abilify 10 mg daily.  Patient and her mother does not want to reduce the dose since it is working very well.  I encouraged healthy lifestyle and watch her calorie intake.  Recommended to call us back if she has any questions or any concerns.  Follow-up in 3 months.  Follow Up Instructions:    I discussed the assessment and treatment plan with the patient. The patient was provided an opportunity to ask questions and all were answered. The patient agreed with the plan and demonstrated an understanding of the instructions.   The patient was advised to call back or seek an in-person evaluation if the symptoms worsen or if the condition fails to improve as anticipated.  I provided 20 minutes of non-face-to-face time  during this encounter.   Kathlee Nations, MD

## 2019-03-20 ENCOUNTER — Other Ambulatory Visit: Payer: Self-pay

## 2019-03-20 ENCOUNTER — Ambulatory Visit (INDEPENDENT_AMBULATORY_CARE_PROVIDER_SITE_OTHER): Payer: Medicaid Other | Admitting: Psychiatry

## 2019-03-20 ENCOUNTER — Encounter (HOSPITAL_COMMUNITY): Payer: Self-pay | Admitting: Psychiatry

## 2019-03-20 DIAGNOSIS — F200 Paranoid schizophrenia: Secondary | ICD-10-CM | POA: Diagnosis not present

## 2019-03-20 DIAGNOSIS — F411 Generalized anxiety disorder: Secondary | ICD-10-CM | POA: Diagnosis not present

## 2019-03-20 MED ORDER — TEMAZEPAM 15 MG PO CAPS: 15.0000 mg | ORAL_CAPSULE | Freq: Every day | ORAL | 0 refills | Status: DC

## 2019-03-20 MED ORDER — SERTRALINE HCL 100 MG PO TABS: 150.0000 mg | ORAL_TABLET | Freq: Every day | ORAL | 0 refills | Status: DC

## 2019-03-20 MED ORDER — ARIPIPRAZOLE 10 MG PO TABS: 10.0000 mg | ORAL_TABLET | Freq: Every day | ORAL | 0 refills | Status: DC

## 2019-03-20 NOTE — Progress Notes (Signed)
Virtual Visit via Telephone Note  I connected with Erika [name] on 03/20/19 at  4:00 PM EST by telephone and verified that I am speaking with the correct person using two identifiers.   I discussed the limitations, risks, security and privacy concerns of performing an evaluation and management service by telephone and the availability of in person appointments. I also discussed with the patient that there may be a patient responsible charge related to this service. The patient expressed understanding and agreed to proceed.   History of Present Illness: Patient was evaluated by phone session.  She had a good Christmas.  She is taking her medication and reported no side effects.  She is working 3 days a week at [employer] and really like her job.  She is hoping to increase more days but due to Covid she had to wait.  Her mother reported that she is sleeping good.  She denies any irritability, anger, mania or any psychosis.  Her last hemoglobin A1c was 6.9 which was done on December 30 by PCP.  However her weight remains unchanged from the past.  Patient noticed increase energy since working.  She wants to keep her current medication.  She denies any crying spells or any feeling of hopelessness.  She takes a lot of precaution going outside because of Covid.  She feels her anxiety is under control.   Past Psychiatric History:Reviewed. H/Opsychiatric illnesss[place]eteens. [facility] of [facility] and noncompliant with medication.H/O ttaking multiple psychotropic medication which gradually reduced and discontinued after close supervision. No h/osuicidal attempt.    Psychiatric Specialty Exam: Physical Exam  Review of Systems  There were no vitals taken for this visit.There is no height or weight on file to calculate BMI.  General Appearance: NA  Eye Contact:  NA  Speech:  Clear and Coherent and Normal Rate  Volume:  Normal  Mood:  Euthymic   Affect:  NA  Thought Process:  Goal Directed  Orientation:  Full (Time, Place, and Person)  Thought Content:  WDL  Suicidal Thoughts:  No  Homicidal Thoughts:  No  Memory:  Immediate;   Fair Recent;   Fair Remote;   Fair  Judgement:  Good  Insight:  Good  Psychomotor Activity:  NA  Concentration:  Concentration: Fair and Attention Span: Fair  Recall:  [employer] of Knowledge:  Fair  Language:  Good  Akathisia:  No  Handed:  Right  AIMS (if indicated):     Assets:  Communication Skills Desire for Improvement Housing Resilience Social Support  ADL's:  Intact  Cognition:  WNL  Sleep:   ok      Assessment and Plan: Schizophrenia chronic paranoid type.  Generalized anxiety disorder.  Patient doing well on her current medication.  Her anxiety, sleep and psychosis under control.  Recently she had issues getting her prescription because of prior authorization but eventually she was able to get the prescription.  Discussed medication side effects and benefits.  Encourage to continue to watch her calorie intake and do regular exercise.  She like 3 days a week work at [employer].  Continue Zoloft and 50 mg daily, Abilify 10 mg daily and temazepam 15 mg at bedtime.  Patient is not interested in therapy.  Recommended to call us back if she has any question of any concern.  Follow-up in 3 months.  Follow Up Instructions:    I discussed the assessment and treatment plan with the patient. The patient was provided  an opportunity to ask questions and all were answered. The patient agreed with the plan and demonstrated an understanding of the instructions.   The patient was advised to call back or seek an in-person evaluation if the symptoms worsen or if the condition fails to improve as anticipated.  I provided 20 minutes of non-face-to-face time during this encounter.   Kathlee Nations, MD

## 2019-03-21 ENCOUNTER — Telehealth (HOSPITAL_COMMUNITY): Payer: Self-pay

## 2019-03-21 NOTE — Telephone Encounter (Signed)
[place] TRACKS PRESCRIPTION COVERAGE APPROVED  ARIPIPRAZOLE 10MG  TABLET PA# EFFECTIVE 03/21/2019 TO 03/15/2020

## 2019-06-19 ENCOUNTER — Telehealth (INDEPENDENT_AMBULATORY_CARE_PROVIDER_SITE_OTHER): Payer: Medicaid Other | Admitting: Psychiatry

## 2019-06-19 ENCOUNTER — Other Ambulatory Visit: Payer: Self-pay

## 2019-06-19 DIAGNOSIS — F411 Generalized anxiety disorder: Secondary | ICD-10-CM | POA: Diagnosis not present

## 2019-06-19 DIAGNOSIS — F200 Paranoid schizophrenia: Secondary | ICD-10-CM

## 2019-06-19 MED ORDER — SERTRALINE HCL 100 MG PO TABS: 150.0000 mg | ORAL_TABLET | Freq: Every day | ORAL | 0 refills | Status: DC

## 2019-06-19 MED ORDER — ARIPIPRAZOLE 10 MG PO TABS: 10.0000 mg | ORAL_TABLET | Freq: Every day | ORAL | 0 refills | Status: DC

## 2019-06-19 MED ORDER — TEMAZEPAM 15 MG PO CAPS: 15.0000 mg | ORAL_CAPSULE | Freq: Every day | ORAL | 0 refills | Status: DC

## 2019-06-19 NOTE — Progress Notes (Signed)
Virtual Visit via Telephone Note  I connected with Erika [name] on 06/19/19 at  4:20 PM EDT by telephone and verified that I am speaking with the correct person using two identifiers.   I discussed the limitations, risks, security and privacy concerns of performing an evaluation and management service by telephone and the availability of in person appointments. I also discussed with the patient that there may be a patient responsible charge related to this service. The patient expressed understanding and agreed to proceed.   History of Present Illness: Patient was evaluated by phone session.  She is stable on her current medication.  She continues to work 3 days a week at [employer] and reported her job is going well.  She is happy that she received Covid vaccine.  Her anxiety is less and she is sleeping better.  She denies any anger, mania, psychosis or any paranoia.  Recently she had a blood work at her primary care physician Dr. Ludwig Clarks and she was told that she lost few pounds and her blood sugar is a stable.  She has no tremors shakes or any EPS.  She denies any panic attacks or any nervousness.  Her mother also endorsed improvement and doing well on her current medication.  Patient denies drinking or using any illegal substances.  Past Psychiatric History:Reviewed. H/Opsychosis sinceteens. [facility] and noncompliant with medication.H/O taking multiple meds which gradually reduced and discontinued. No h/osuicidal attempt.    Psychiatric Specialty Exam: Physical Exam  Review of Systems  There were no vitals taken for this visit.There is no height or weight on file to calculate BMI.  General Appearance: NA  Eye Contact:  NA  Speech:  Slow  Volume:  Normal  Mood:  Euthymic  Affect:  NA  Thought Process:  Descriptions of Associations: Intact  Orientation:  Full (Time, Place, and Person)  Thought Content:  WDL  Suicidal Thoughts:  No   Homicidal Thoughts:  No  Memory:  Immediate;   Fair Recent;   Fair Remote;   Fair  Judgement:  Good  Insight:  Good  Psychomotor Activity:  NA  Concentration:  Concentration: Fair and Attention Span: Fair  Recall:  Good  Fund of Knowledge:  Good  Language:  Good  Akathisia:  No  Handed:  Right  AIMS (if indicated):     Assets:  Communication Skills Desire for Improvement Housing Resilience Social Support  ADL's:  Intact  Cognition:  WNL  Sleep:   good      Assessment and Plan: Schizophrenia chronic paranoid type.  Generalized anxiety disorder.  Patient is a stable on her current medication.  Encourage healthy lifestyle and watch her calorie intake.  Continue Zoloft 150 mg daily, Abilify 10 mg daily and temazepam 15 mg at bedtime.  Recommended to call us back if she has any question or any concern.  Follow-up in 3 months.  Follow Up Instructions:    I discussed the assessment and treatment plan with the patient. The patient was provided an opportunity to ask questions and all were answered. The patient agreed with the plan and demonstrated an understanding of the instructions.   The patient was advised to call back or seek an in-person evaluation if the symptoms worsen or if the condition fails to improve as anticipated.  I provided 20 minutes of non-face-to-face time during this encounter.   Cleotis Nipper, MD

## 2019-09-04 ENCOUNTER — Other Ambulatory Visit: Payer: Self-pay

## 2019-09-04 ENCOUNTER — Telehealth (INDEPENDENT_AMBULATORY_CARE_PROVIDER_SITE_OTHER): Payer: Medicaid Other | Admitting: Psychiatry

## 2019-09-04 DIAGNOSIS — F411 Generalized anxiety disorder: Secondary | ICD-10-CM | POA: Diagnosis not present

## 2019-09-04 DIAGNOSIS — F200 Paranoid schizophrenia: Secondary | ICD-10-CM | POA: Diagnosis not present

## 2019-09-04 MED ORDER — ARIPIPRAZOLE 10 MG PO TABS: 10.0000 mg | ORAL_TABLET | Freq: Every day | ORAL | 0 refills | Status: DC

## 2019-09-04 MED ORDER — TEMAZEPAM 15 MG PO CAPS: 15.0000 mg | ORAL_CAPSULE | Freq: Every day | ORAL | 0 refills | Status: DC

## 2019-09-04 MED ORDER — SERTRALINE HCL 100 MG PO TABS: 150.0000 mg | ORAL_TABLET | Freq: Every day | ORAL | 0 refills | Status: DC

## 2019-09-04 NOTE — Progress Notes (Signed)
Virtual Visit via Telephone Note  I connected with Erika [name] on 09/04/19 at  2:00 PM EDT by telephone and verified that I am speaking with the correct person using two identifiers.  Location: Patient: home Provider: home office   I discussed the limitations, risks, security and privacy concerns of performing an evaluation and management service by telephone and the availability of in person appointments. I also discussed with the patient that there may be a patient responsible charge related to this service. The patient expressed understanding and agreed to proceed.   History of Present Illness: Patient is evaluated by phone session.  She is taking her medication as prescribed.  Lately she had diarrhea and she lost 10 pounds.  She called the PCP who run the test and found to have high WBC count.  Her symptoms are resolved and she was told if symptoms come back and she should call the PCP again.  Patient was told it is a viral infection.  Overall she described that her mood paranoia and anger is stable.  She sleeps good.  She continues to work 3 days a week at [employer] and like her job.  She is sleeping okay.  She has no tremors, shakes or any EPS.  She had a good family gathering on July 4 and had a cookout.  Patient like to keep the current medication.  I also spoke to her mother also endorses that patient doing well on current medication and does not want to change at this time.    Past Psychiatric History:Reviewed. H/Opsychosis sinceteens. [facility] and noncompliant with medication.H/O taking multiple meds which gradually reduced and discontinued. No h/osuicidal attempt.    Psychiatric Specialty Exam: Physical Exam  Review of Systems  There were no vitals taken for this visit.There is no height or weight on file to calculate BMI.  General Appearance: NA  Eye Contact:  NA  Speech:  Slow  Volume:  Normal  Mood:  Euthymic  Affect:  NA   Thought Process:  Goal Directed  Orientation:  Full (Time, Place, and Person)  Thought Content:  WDL  Suicidal Thoughts:  No  Homicidal Thoughts:  No  Memory:  Immediate;   Fair Recent;   Fair Remote;   Fair  Judgement:  Intact  Insight:  Present  Psychomotor Activity:  NA  Concentration:  Concentration: Fair and Attention Span: Fair  Recall:  Good  Fund of Knowledge:  Good  Language:  Good  Akathisia:  No  Handed:  Right  AIMS (if indicated):     Assets:  Communication Skills Desire for Improvement Housing Resilience Social Support  ADL's:  Intact  Cognition:  WNL  Sleep:   good      Assessment and Plan: Schizophrenia chronic paranoid type.  Generalized anxiety disorder.  Patient is a stable on her medication.  Recently she had GI signs and symptoms which resolved and I reminded that if symptoms come back then she should call the PCP.  Patient does not want to change medication.  Continue Zoloft 150 mg daily, Abilify 10 mg daily and temazepam 15 mg at bedtime.  Recommended to call us back if she is any question or any concern.  Follow-up in 3 months.  Follow Up Instructions:    I discussed the assessment and treatment plan with the patient. The patient was provided an opportunity to ask questions and all were answered. The patient agreed with the plan and demonstrated an understanding of  the instructions.   The patient was advised to call back or seek an in-person evaluation if the symptoms worsen or if the condition fails to improve as anticipated.  I provided 15 minutes of non-face-to-face time during this encounter.   Kathlee Nations, MD

## 2019-11-12 ENCOUNTER — Ambulatory Visit: Payer: Medicaid Other | Admitting: Family Medicine

## 2019-12-04 ENCOUNTER — Telehealth (INDEPENDENT_AMBULATORY_CARE_PROVIDER_SITE_OTHER): Payer: Medicaid Other | Admitting: Psychiatry

## 2019-12-04 ENCOUNTER — Other Ambulatory Visit: Payer: Self-pay

## 2019-12-04 DIAGNOSIS — F200 Paranoid schizophrenia: Secondary | ICD-10-CM | POA: Diagnosis not present

## 2019-12-04 DIAGNOSIS — F411 Generalized anxiety disorder: Secondary | ICD-10-CM

## 2019-12-04 MED ORDER — SERTRALINE HCL 100 MG PO TABS: 150.0000 mg | ORAL_TABLET | Freq: Every day | ORAL | 0 refills | Status: DC

## 2019-12-04 MED ORDER — TEMAZEPAM 15 MG PO CAPS: 15.0000 mg | ORAL_CAPSULE | Freq: Every day | ORAL | 0 refills | Status: DC

## 2019-12-04 MED ORDER — ARIPIPRAZOLE 10 MG PO TABS: 10.0000 mg | ORAL_TABLET | Freq: Every day | ORAL | 0 refills | Status: DC

## 2019-12-04 NOTE — Progress Notes (Signed)
Virtual Visit via Telephone Note  I connected with Erika [name] on 12/04/19 at  2:00 PM EDT by telephone and verified that I am speaking with the correct person using two identifiers.  Location: Patient: home Provider: home office   I discussed the limitations, risks, security and privacy concerns of performing an evaluation and management service by telephone and the availability of in person appointments. I also discussed with the patient that there may be a patient responsible charge related to this service. The patient expressed understanding and agreed to proceed.   History of Present Illness: Patient is evaluated by phone session.  I also spoke to her mother who lives close by.  She has been doing good and reported no concerns from the medication.  She denies any paranoia, hallucination or any anger.  Her anxiety is under control.  She continues to work 3 days a week at [employer] and keep herself busy.  On the last visit she reported diarrhea which stopped after her PCP gave some medicine.  She has appointment tomorrow to see Dr. Ludwig Clarks and she will get blood work.  Patient does not want to change medication since it is working very well.  She has no tremors, shakes or any EPS.  She had a good support from her mother.  Past Psychiatric History: H/Opsychosissinceteens. [facility] and non[facility]iant with medication.H/O taking multiplemedswhich gradually reduced and discontinued.No h/osuicidal attempt.    Psychiatric Specialty Exam: Physical Exam  Review of Systems  There were no vitals taken for this visit.There is no height or weight on file to calculate BMI.  General Appearance: NA  Eye Contact:  NA  Speech:  Slow  Volume:  Decreased  Mood:  Euthymic  Affect:  NA  Thought Process:  Goal Directed  Orientation:  Full (Time, Place, and Person)  Thought Content:  WDL  Suicidal Thoughts:  No  Homicidal Thoughts:  No  Memory:   Immediate;   Good Recent;   Fair Remote;   Fair  Judgement:  Intact  Insight:  Present  Psychomotor Activity:  NA  Concentration:  Concentration: Fair and Attention Span: Fair  Recall:  [employer] of Knowledge:  Fair  Language:  Good  Akathisia:  No  Handed:  Right  AIMS (if indicated):     Assets:  Communication Skills Desire for Improvement Housing Resilience Social Support  ADL's:  Intact  Cognition:  WNL  Sleep:   good      Assessment and Plan: Schizophrenia chronic paranoid type.  Generalized anxiety disorder.  Patient is a stable on her current medication.  Tomorrow she had a blood work at her PCP.  I reminded that blood work results faxed to [place].  She is taking Abilify and some time that can increase blood sugar.  She has not checked her weight in a while but tomorrow she will get the weight and I also reminded when PCP send the fax if they can include current weight.  Patient agreed to remind her PCP for labs and current weight.  Continue Zoloft 150 mg daily, Abilify 10 mg daily and temazepam 15 mg at bedtime.  Recommended to call us back if she is any question or any concern.  Follow-up in 3 months.  Follow Up Instructions:    I discussed the assessment and treatment plan with the patient. The patient was provided an opportunity to ask questions and all were answered. The patient agreed with the plan and demonstrated an understanding of  the instructions.   The patient was advised to call back or seek an in-person evaluation if the symptoms worsen or if the condition fails to improve as anticipated.  I provided 18 minutes of non-face-to-face time during this encounter.   Cleotis Nipper, MD

## 2019-12-10 ENCOUNTER — Other Ambulatory Visit: Payer: Self-pay | Admitting: Internal Medicine

## 2019-12-10 DIAGNOSIS — Z1231 Encounter for screening mammogram for malignant neoplasm of breast: Secondary | ICD-10-CM

## 2019-12-11 ENCOUNTER — Ambulatory Visit
Admission: RE | Admit: 2019-12-11 | Discharge: 2019-12-11 | Disposition: A | Discharge status: Home / Self Care | Payer: Medicaid Other | Source: Ambulatory Visit | Attending: Internal Medicine | Admitting: Internal Medicine

## 2019-12-11 ENCOUNTER — Telehealth (HOSPITAL_COMMUNITY): Payer: Self-pay | Admitting: Psychiatry

## 2019-12-11 ENCOUNTER — Other Ambulatory Visit: Payer: Self-pay

## 2019-12-11 DIAGNOSIS — Z1231 Encounter for screening mammogram for malignant neoplasm of breast: Secondary | ICD-10-CM

## 2019-12-11 NOTE — Telephone Encounter (Signed)
Received blood results which was drawn on October 7 by her PCP.  Her cholesterol is 173, triglyceride 143, HDL 39, LDL 105.  Her hemoglobin A1c is 7.3 which is high.  Her creatinine is 0.63, BUN 9 and her sodium potassium and liver enzymes were normal.  Her CBC also normal with normal platelets.  We will scan the copy and put in the chart.

## 2020-01-07 NOTE — Progress Notes (Signed)
PATIENT: Erika [name] DOB: 11-25-1975  REASON FOR VISIT: follow up HISTORY FROM: patient  Chief Complaint  Patient presents with  . Follow-up    rm 2  . Sleep Apnea    pt said she feels better since using the cpap.     HISTORY OF PRESENT ILLNESS: Today 01/08/20 Erika [name] is a 44 y.o. female here today for follow up for OSA on BiPAP.  She is doing very well on BiPAP therapy. She presents today with her mother who states that she is sleeping very well. They are excited that she is doing so well. She wakes feeling refreshed. She is able to work without becoming sleepy.   Compliance report dated 12/09/2019 through 01/07/2020 reveals that she used BiPAP 29 of the past 30 days for compliance of 97%.  She used BiPAP greater than 4 hours all 29 days.  Average usage was 7 hours and 13 minutes.  Residual AHI was 4.1 with IPAP of 18 and EPAP of 14 cm water pressure.  Respiratory rate of 10.  There was a large leak noted in the 95th percentile of 76.6 L/min.  HISTORY: (copied from Dr Dohmeier's note on 11/09/2018)  11-09-2018 Erika [name] is a 44 y.o. female with BiPAP treatment and MRDD , seen here as in a referral/ revisit  from Dr. Verl Dicker Partner , Dr. Rosemary Holms.  Patient was last seen 1 year ago, by Darrol Angel, NP. In the meantime she had a bite guard made, which helped the patient with bruxism prevention but also seems to have had a positive effect on her breathing in conjunction with BiPAP 1 of many  risk factors has been her weight and her current BMI is 46.35.  However she has been a very compliant user of BiPAP for the treatment of obstructive sleep apnea and has no issues with the machine. Dr. Hulen Skains is her dentist, Miss Jettie  has continued to be highly compliant she has used the machine 29 of the last 30 days including 08 November 2018.   Average user time is 6 hours and 27 minutes.  Her inspiratory pressure is 18 expiratory pressure 14 cmH2O and she uses a  background SVT rate of 10 bpm so rate 10 breaths/min.  Residual AHI is 4.9 which is fine and she does have some major air leaks but the have been addressed with a new mask.   The AHI varies greatly from night to night.  The respiratory rate however states constant.  I do not need to make any adjustments in settings.  Interval history from 04 May 2017, I have the pleasure of meeting with Erika [name] and her mother today for the first time since her sleep studies were performed.  The patient underwent a split-night polysomnography on 24 December 2016 and the study confirmed that he has a high degree of apnea with an AHI of 44.4/h of sleep.  This means that she has an apnea about every 80 seconds.  She spent all night in supine position so we could not determine if there was a positional component, and she did not reach REM sleep either.  She did not have significant low oxygen saturations none of prolonged duration however.  She was started on CPAP first was 5 cmH2O and was step-by-step titrated to 15 CPAP caused central apneas to emerge and did not reduce the overall apnea index.  The patient was therefore changed briefly to BiPAP but at that time the night was  almost over.  Pressures of 15/10 centimeter and 17/12 centimeters were tried.  We asked the patient to return for a full night BiPAP titration based on these limited results.  She also developed some periodic limb movements at night but were not present in her first study.  She returned for a full night titration on 30 November, BiPAP was now initiated at a low pressure and slowly advanced.  The first pressure was 8/5 cmH2O and she finally reached 17/13 cmH2O but her AHI was reduced to 3.8 apneas per hour of sleep from previously over 44.  Snoring also was alleviated at that higher pressure.  She was placed on a full facemask which I had initially not wanted but he tolerated a small size Simplus fullface mask very well.   [name] mother is  actually more thrilled than the patient about the results.  She no longer hears her daughter snoring or have apnea, the machine is very quiet and it has improved her own sleep significantly.  Erika [name] has been very compliant she has used the machine over 4 hours nightly for 26 out of the last 30 days, and she has used it on every of the last 30 days.  Average use of time is 5 hours and 49 minutes, the machine is set at 18/14 cmH2O BiPAP with a background respiratory rate of 10 breaths/min, her residual AHI is 0.0.  There are no more apneas.  She does have some high air leaks but these do not affect her sleep quality or the apnea control.  If troponin once I would change her to a nasal pillow or nasal mask.  Consult : She is according to her mother's observation snoring very loudly, for many years. She gained weight over the last 5 years, partially attributed to depression. The patient has a long psychiatric history, has a learning disability( Dr. Lolly Mustache)  She Gained weight over the last years due to psychotropic medication for the treatment of depression, she also has developed hypertension, and she was witnessed to have apnea after a medical procedure, but she cannot remember what kind of procedures this was. Sleep habits are as follows: Erika [name] usually watches TV for the last hours of the day before she goes to bed. Her bedtime is around 10 PM, and she retreats to her bedroom at this time. Her bedroom is cool quiet and dark. She sleeps on her side. She avoids the right side because of an ophthalmological condition. She wears a mask for eye protection to sleep. She wakes up frequently to go to the bathroom at night at least 2 times. She rises in the morning and 5:15. Usually she has got 6 hours of sleep by that time. She feels usually rested and restored in the morning but also reports a dry mouth. She likes to chew gum to give her relief from the dry mouth. She also drinks a lot of water during the day,  but has to be reminded.  Sleep medical history and family sleep history: There is no other known family history of sleep apnea, but Ms. Henner has been observed reading irregular in her sleep. She has diabetes, HTN, super obesity. Abnormal EKG- "flutter " .  She is on a sleep walker, has no history of night terrors or enuresis. She never underwent tonsillectomy and has no history of neck surgery or trauma.  Social history: Ms. Andy lives with her parents, she is usually the first one in bed and rises without resistance and  the morning. She goes to an adult enrichment center, and enjoys it. She does not use tobacco in any form, she does not drink alcohol, caffeine use she does drink coffee, soda and ice tea. She does not drink more than 1 or 2 cups of caffeinated beverage a day. Mostly soda.    REVIEW OF SYSTEMS: Out of a complete 14 system review of symptoms, the patient complains only of the following symptoms, none and all other reviewed systems are negative.  ESS: 6 FSS: 14  ALLERGIES: Allergies  Allergen Reactions  . 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 Accu-Chek FastClix Lancets MISC USE TO CHECK GLUCOSE TWICE DAILY    . ACCU-CHEK GUIDE test strip USE 1 TEST STRIP TO TEST BLOOD GLUCOSE ONCE DAILY AS DIRECTED    . ARIPiprazole (ABILIFY) 10 MG tablet Take 1 tablet (10 mg total) by mouth daily. 90 tablet 0  . atorvastatin (LIPITOR) 20 MG tablet Take 20 mg by mouth at bedtime.    . benazepril (LOTENSIN) 10 MG tablet Take 10 mg by mouth daily.    . cholecalciferol (VITAMIN D) 1000 units tablet Take 1,000 Units by mouth daily.    . Ertugliflozin-metFORMIN HCl (SEGLUROMET) 7.06-998 MG TABS Take 1 tablet by mouth daily.    . famotidine (PEPCID) 20 MG tablet Take 20 mg by mouth 3 (three) times daily.    Marland Kitchen  ketoconazole (NIZORAL) 2 % cream APPLY CREAM TOPICALLY TO AFFECTED AREA ONCE DAILY    . Papaya Enzyme CHEW Chew 1 each by mouth 3 (three) times daily after meals.    . sertraline (ZOLOFT) 100 MG tablet Take 1.5 tablets (150 mg total) by mouth daily. 135 tablet 0  . temazepam (RESTORIL) 15 MG capsule Take 1 capsule (15 mg total) by mouth at bedtime. 90 capsule 0   No facility-administered medications prior to visit.    PAST MEDICAL HISTORY: Past Medical History:  Diagnosis Date  . Anxiety   . Depression   . Diabetes (HCC)   . GERD (gastroesophageal reflux disease)   . GERD (gastroesophageal reflux disease)   . Hyperlipemia   . Hyperlipidemia   . Obesity   . OSA treated with BiPAP     PAST SURGICAL HISTORY: Past Surgical History:  Procedure Laterality Date  . ABDOMINAL HYSTERECTOMY    . CHOLECYSTECTOMY    . ESOPHAGOGASTRODUODENOSCOPY (EGD) WITH PROPOFOL N/A 07/07/2016   Procedure: ESOPHAGOGASTRODUODENOSCOPY (EGD) WITH PROPOFOL;  Surgeon: Carman Ching, MD;  Location: [facility];  Service: Endoscopy;  Laterality: N/A;    FAMILY HISTORY: Family History  Problem Relation Age of Onset  . Hypertension Mother   . Hyperlipidemia Mother   . Diabetes Mother   . Breast cancer Mother 53  . Diabetes Father   . Hypertension Father     SOCIAL HISTORY: Social History   Socioeconomic History  . Marital status: Single    Spouse name: Not on file  . Number of children: Not on file  . Years of education: Not on file  . Highest education level: Not on file  Occupational History  . Not on file  Tobacco Use  . Smoking status: Never Smoker  . Smokeless tobacco: Never Used  Vaping Use  . Vaping Use: Never used  Substance and Sexual Activity  . Alcohol use: No    Alcohol/week: 0.0 standard drinks  .  Drug use: No  . Sexual activity: Not Currently    Birth control/protection: Surgical  Other Topics Concern  . Not on file  Social History Narrative   Lives with her mom   Social  Determinants of Health   Financial Resource Strain:   . Difficulty of Paying Living Expenses: Not on file  Food Insecurity:   . Worried About Programme researcher, broadcasting/film/videounning Out of Food in the Last Year: Not on file  . Ran Out of Food in the Last Year: Not on file  Transportation Needs:   . Lack of Transportation (Medical): Not on file  . Lack of Transportation (Non-Medical): Not on file  Physical Activity:   . Days of Exercise per Week: Not on file  . Minutes of Exercise per Session: Not on file  Stress:   . Feeling of Stress : Not on file  Social Connections:   . Frequency of Communication with Friends and Family: Not on file  . Frequency of Social Gatherings with Friends and Family: Not on file  . Attends Religious Services: Not on file  . Active Member of Clubs or Organizations: Not on file  . Attends BankerClub or Organization Meetings: Not on file  . Marital Status: Not on file  Intimate Partner Violence:   . Fear of Current or Ex-Partner: Not on file  . Emotionally Abused: Not on file  . Physically Abused: Not on file  . Sexually Abused: Not on file     PHYSICAL EXAM  Vitals:   01/08/20 0805  BP: 137/78  Pulse: 73  Weight: 265 lb (120.2 kg)  Height: 5\' 1"  (1.549 m)   Body mass index is 50.07 kg/m.  Generalized: Well developed, in no acute distress  Cardiology: normal rate and rhythm, no murmur noted Respiratory: clear to auscultation bilaterally  Neurological examination  Mentation: Alert oriented to time, place, history taking. Follows all commands speech and language fluent Cranial nerve II-XII: Pupils were equal round reactive to light. Extraocular movements were full, visual field were full  Motor: The motor testing reveals 5 over 5 strength of all 4 extremities. Good symmetric motor tone is noted throughout.  Gait and station: Gait is normal.    DIAGNOSTIC DATA (LABS, IMAGING, TESTING) - I reviewed patient records, labs, notes, testing and imaging myself where available.  No  flowsheet data found.   No results found for: WBC, HGB, HCT, MCV, PLT No results found for: NA, K, CL, CO2, GLUCOSE, BUN, CREATININE, CALCIUM, PROT, ALBUMIN, AST, ALT, ALKPHOS, BILITOT, GFRNONAA, GFRAA No results found for: CHOL, HDL, LDLCALC, LDLDIRECT, TRIG, CHOLHDL No results found for: XLKG4WHGBA1C No results found for: VITAMINB12 No results found for: TSH   ASSESSMENT AND PLAN 44 y.o. year old female  has a past medical history of Anxiety, Depression, Diabetes (HCC), GERD (gastroesophageal reflux disease), GERD (gastroesophageal reflux disease), Hyperlipemia, Hyperlipidemia, Obesity, and OSA treated with BiPAP. here with     ICD-10-CM   1. OSA treated with BiPAP  G47.33 For home use only DME Bipap     Lloyd Hugerawona N Overholt is doing well on BiPAP therapy. She does continue to have a large leak, however, AHI is ar goal. I have educated her and her mother of ways to monitor for and correct leak at home. She does not wish to have a mask refitting at this time as she is sleeping very well and feels nasal mask is comfortable. May consider chin strap in future if she is willing. She was encouraged to continue using BiPAP nightly  and for greater than 4 hours each night. We will update supply orders as indicated. Risks of untreated sleep apnea review and education materials provided. Healthy lifestyle habits encouraged. She will follow up in 1 year, sooner if needed. She verbalizes understanding and agreement with this plan.    Orders Placed This Encounter  Procedures  . For home use only DME Bipap    supplies    Order Specific Question:   Length of Need    Answer:   Lifetime    Order Specific Question:   Inspiratory pressure    Answer:   OTHER SEE COMMENTS    Order Specific Question:   Expiratory pressure    Answer:   OTHER SEE COMMENTS     No orders of the defined types were placed in this encounter.     I spent 15 minutes with the patient. 50% of this time was spent counseling and educating  patient on plan of care and medications.    Shawnie Dapper, FNP-C 01/08/2020, 8:44 AM [facility] [address] [place], [place] [address] [phone]

## 2020-01-07 NOTE — Patient Instructions (Addendum)
Please continue using your BiPAP regularly. While your insurance requires that you use BiPAP at least 4 hours each night on 70% of the nights, I recommend, that you not skip any nights and use it throughout the night if you can. Getting used to BiPAP and staying with the treatment long term does take time and patience and discipline. Untreated obstructive sleep apnea when it is moderate to severe can have an adverse impact on cardiovascular health and raise her risk for heart disease, arrhythmias, hypertension, congestive heart failure, stroke and diabetes. Untreated obstructive sleep apnea causes sleep disruption, nonrestorative sleep, and sleep deprivation. This can have an impact on your day to day functioning and cause daytime sleepiness and impairment of cognitive function, memory loss, mood disturbance, and problems focussing. Using BiPAP regularly can improve these symptoms.  Continue to monitor for a leak at home. I am happy to send you for a mask refitting if needed.   Follow up in 1 year    Sleep Apnea Sleep apnea affects breathing during sleep. It causes breathing to stop for a short time or to become shallow. It can also increase the risk of:  Heart attack.  Stroke.  Being very overweight (obese).  Diabetes.  Heart failure.  Irregular heartbeat. The goal of treatment is to help you breathe normally again. What are the causes? There are three kinds of sleep apnea:  Obstructive sleep apnea. This is caused by a blocked or collapsed airway.  Central sleep apnea. This happens when the brain does not send the right signals to the muscles that control breathing.  Mixed sleep apnea. This is a combination of obstructive and central sleep apnea. The most common cause of this condition is a collapsed or blocked airway. This can happen if:  Your throat muscles are too relaxed.  Your tongue and tonsils are too large.  You are overweight.  Your airway is too small. What  increases the risk?  Being overweight.  Smoking.  Having a small airway.  Being older.  Being female.  Drinking alcohol.  Taking medicines to calm yourself (sedatives or tranquilizers).  Having family members with the condition. What are the signs or symptoms?  Trouble staying asleep.  Being sleepy or tired during the day.  Getting angry a lot.  Loud snoring.  Headaches in the morning.  Not being able to focus your mind (concentrate).  Forgetting things.  Less interest in sex.  Mood swings.  Personality changes.  Feelings of sadness (depression).  Waking up a lot during the night to pee (urinate).  Dry mouth.  Sore throat. How is this diagnosed?  Your medical history.  A physical exam.  A test that is done when you are sleeping (sleep study). The test is most often done in a sleep lab but may also be done at home. How is this treated?   Sleeping on your side.  Using a medicine to get rid of mucus in your nose (decongestant).  Avoiding the use of alcohol, medicines to help you relax, or certain pain medicines (narcotics).  Losing weight, if needed.  Changing your diet.  Not smoking.  Using a machine to open your airway while you sleep, such as: ? An oral appliance. This is a mouthpiece that shifts your lower jaw forward. ? A CPAP device. This device blows air through a mask when you breathe out (exhale). ? An EPAP device. This has valves that you put in each nostril. ? A BPAP device. This device blows air  through a mask when you breathe in (inhale) and breathe out.  Having surgery if other treatments do not work. It is important to get treatment for sleep apnea. Without treatment, it can lead to:  High blood pressure.  Coronary artery disease.  In men, not being able to have an erection (impotence).  Reduced thinking ability. Follow these instructions at home: Lifestyle  Make changes that your doctor recommends.  Eat a healthy  diet.  Lose weight if needed.  Avoid alcohol, medicines to help you relax, and some pain medicines.  Do not use any products that contain nicotine or tobacco, such as cigarettes, e-cigarettes, and chewing tobacco. If you need help quitting, ask your doctor. General instructions  Take over-the-counter and prescription medicines only as told by your doctor.  If you were given a machine to use while you sleep, use it only as told by your doctor.  If you are having surgery, make sure to tell your doctor you have sleep apnea. You may need to bring your device with you.  Keep all follow-up visits as told by your doctor. This is important. Contact a doctor if:  The machine that you were given to use during sleep bothers you or does not seem to be working.  You do not get better.  You get worse. Get help right away if:  Your chest hurts.  You have trouble breathing in enough air.  You have an uncomfortable feeling in your back, arms, or stomach.  You have trouble talking.  One side of your body feels weak.  A part of your face is hanging down. These symptoms may be an emergency. Do not wait to see if the symptoms will go away. Get medical help right away. Call your local emergency services (911 in the [place] Do not drive yourself to the hospital. Summary  This condition affects breathing during sleep.  The most common cause is a collapsed or blocked airway.  The goal of treatment is to help you breathe normally while you sleep. This information is not intended to replace advice given to you by your health care provider. Make sure you discuss any questions you have with your health care provider. Document Revised: 12/02/2017 Document Reviewed: 10/11/2017 Elsevier Patient Education  2020 [employer].

## 2020-01-08 ENCOUNTER — Other Ambulatory Visit: Payer: Self-pay

## 2020-01-08 ENCOUNTER — Ambulatory Visit: Payer: Medicaid Other | Admitting: Family Medicine

## 2020-01-08 ENCOUNTER — Encounter: Payer: Self-pay | Admitting: Family Medicine

## 2020-01-08 VITALS — BP 137/78 | HR 73 | Ht 61.0 in | Wt 265.0 lb

## 2020-01-08 DIAGNOSIS — G4733 Obstructive sleep apnea (adult) (pediatric): Secondary | ICD-10-CM | POA: Diagnosis not present

## 2020-01-08 NOTE — Progress Notes (Signed)
Order for cpap supplies sent to [facility] via community msg. Confirmation received that the order transmitted was successful.  

## 2020-01-11 ENCOUNTER — Other Ambulatory Visit: Payer: Self-pay | Admitting: Gastroenterology

## 2020-02-09 ENCOUNTER — Other Ambulatory Visit (HOSPITAL_COMMUNITY): Payer: Medicaid Other

## 2020-03-04 ENCOUNTER — Encounter (HOSPITAL_COMMUNITY): Payer: Self-pay | Admitting: Psychiatry

## 2020-03-04 ENCOUNTER — Other Ambulatory Visit: Payer: Self-pay

## 2020-03-04 ENCOUNTER — Telehealth (INDEPENDENT_AMBULATORY_CARE_PROVIDER_SITE_OTHER): Payer: Medicaid Other | Admitting: Psychiatry

## 2020-03-04 VITALS — Wt 265.0 lb

## 2020-03-04 DIAGNOSIS — F419 Anxiety disorder, unspecified: Secondary | ICD-10-CM

## 2020-03-04 DIAGNOSIS — F200 Paranoid schizophrenia: Secondary | ICD-10-CM

## 2020-03-04 MED ORDER — TEMAZEPAM 15 MG PO CAPS: 15.0000 mg | ORAL_CAPSULE | Freq: Every day | ORAL | 0 refills | Status: DC

## 2020-03-04 MED ORDER — ARIPIPRAZOLE 10 MG PO TABS: 10.0000 mg | ORAL_TABLET | Freq: Every day | ORAL | 0 refills | Status: DC

## 2020-03-04 MED ORDER — SERTRALINE HCL 100 MG PO TABS: 150.0000 mg | ORAL_TABLET | Freq: Every day | ORAL | 0 refills | Status: DC

## 2020-03-04 NOTE — Progress Notes (Signed)
Virtual Visit via Telephone Note  I connected with Erika [name] on 03/04/20 at  4:20 PM EST by telephone and verified that I am speaking with the correct person using two identifiers.  Location: Patient: Home Provider: Home Office   I discussed the limitations, risks, security and privacy concerns of performing an evaluation and management service by telephone and the availability of in person appointments. I also discussed with the patient that there may be a patient responsible charge related to this service. The patient expressed understanding and agreed to proceed.  She denies any hallucination.  Her thinking is much clearer and organized.  She is using CPAP is helping her sleep.  I reviewed blood work from her PCP which was done 3 months ago.  She is more active and losing 1 to 2 pounds every few weeks.  She feels more active and energetic.  She does not want to change the medication since it is working well.   History of Present Illness: Patient is evaluated by phone.  Her mother was also available on the phone.  Patient is doing very well on her current medication.  She is now off work to work 5 days a week.  She is very happy about it.  She feels current medicine is working and denies any paranoia, irritability, panic attacks.  Past Psychiatric History: H/Opsychosissinceteens. H[facility] and noncompliant with medication.H/O taking multiplemedswhich gradually reduced and discontinued.No h/osuicidal attempt.   Psychiatric Specialty Exam: Physical Exam  Review of Systems  Weight 265 lb (120.2 kg).There is no height or weight on file to calculate BMI.  General Appearance: NA  Eye Contact:  NA  Speech:  Slow  Volume:  Normal  Mood:  Euthymic  Affect:  NA  Thought Process:  Goal Directed  Orientation:  Full (Time, Place, and Person)  Thought Content:  WDL  Suicidal Thoughts:  No  Homicidal Thoughts:  No  Memory:  Immediate;    Good Recent;   Fair Remote;   Fair  Judgement:  Intact  Insight:  Present  Psychomotor Activity:  NA  Concentration:  Concentration: Fair and Attention Span: Fair  Recall:  [employer] of Knowledge:  Fair  Language:  Good  Akathisia:  No  Handed:  Right  AIMS (if indicated):     Assets:  Communication Skills Desire for Improvement Housing Resilience Social Support  ADL's:  Intact  Cognition:  WNL  Sleep:   good with cpap      Assessment and Plan: Schizophrenia chronic paranoid type.  Anxiety.  Patient is a stable on her current medication.  Continue Abilify 10 mg daily, temazepam 15 mg at bedtime and Zoloft 150 mg daily.  Recommended to call us back if she has any question or any concern.  Follow-up in 3 months.  Follow Up Instructions:    I discussed the assessment and treatment plan with the patient. The patient was provided an opportunity to ask questions and all were answered. The patient agreed with the plan and demonstrated an understanding of the instructions.   The patient was advised to call back or seek an in-person evaluation if the symptoms worsen or if the condition fails to improve as anticipated.  I provided 12 minutes of non-face-to-face time during this encounter.   Cleotis Nipper, MD

## 2020-03-06 ENCOUNTER — Other Ambulatory Visit: Payer: Self-pay

## 2020-03-06 ENCOUNTER — Encounter (HOSPITAL_COMMUNITY): Payer: Self-pay | Admitting: Gastroenterology

## 2020-03-11 ENCOUNTER — Other Ambulatory Visit (HOSPITAL_COMMUNITY): Payer: Self-pay | Admitting: Psychiatry

## 2020-03-11 DIAGNOSIS — F200 Paranoid schizophrenia: Secondary | ICD-10-CM

## 2020-03-11 DIAGNOSIS — F419 Anxiety disorder, unspecified: Secondary | ICD-10-CM

## 2020-03-15 ENCOUNTER — Other Ambulatory Visit (HOSPITAL_COMMUNITY)
Admission: RE | Admit: 2020-03-15 | Discharge: 2020-03-15 | Disposition: A | Discharge status: Home / Self Care | Payer: Medicaid Other | Source: Ambulatory Visit | Attending: Gastroenterology | Admitting: Gastroenterology

## 2020-03-15 DIAGNOSIS — Z20822 Contact with and (suspected) exposure to covid-19: Secondary | ICD-10-CM | POA: Diagnosis not present

## 2020-03-15 DIAGNOSIS — Z01812 Encounter for preprocedural laboratory examination: Secondary | ICD-10-CM | POA: Diagnosis present

## 2020-03-15 LAB — SARS CORONAVIRUS 2 (TAT 6-24 HRS): SARS Coronavirus 2: NEGATIVE

## 2020-03-18 NOTE — Anesthesia Preprocedure Evaluation (Addendum)
Anesthesia Evaluation  Patient identified by MRN, date of birth, ID band Patient awake    Reviewed: Allergy & Precautions, NPO status , Patient's Chart, lab work & pertinent test results  History of Anesthesia Complications Negative for: history of anesthetic complications  Airway Mallampati: III  TM Distance: >3 FB Neck ROM: Full    Dental no notable dental hx. (+) Dental Advisory Given   Pulmonary sleep apnea and Continuous Positive Airway Pressure Ventilation ,    Pulmonary exam normal        Cardiovascular negative cardio ROS Normal cardiovascular exam     Neuro/Psych PSYCHIATRIC DISORDERS Anxiety Depression Schizophrenia negative neurological ROS     GI/Hepatic Neg liver ROS, GERD  ,  Endo/Other  diabetesMorbid obesity  Renal/GU negative Renal ROS     Musculoskeletal negative musculoskeletal ROS (+)   Abdominal   Peds  Hematology negative hematology ROS (+)   Anesthesia Other Findings   Reproductive/Obstetrics                            Anesthesia Physical Anesthesia Plan  ASA: III  Anesthesia Plan: MAC   Post-op Pain Management:    Induction:   PONV Risk Score and Plan: 2 and Ondansetron and Propofol infusion  Airway Management Planned: Natural Airway  Additional Equipment:   Intra-op Plan:   Post-operative Plan:   Informed Consent: I have reviewed the patients History and Physical, chart, labs and discussed the procedure including the risks, benefits and alternatives for the proposed anesthesia with the patient or authorized representative who has indicated his/her understanding and acceptance.     Dental advisory given  Plan Discussed with: Anesthesiologist and CRNA  Anesthesia Plan Comments:        Anesthesia Quick Evaluation

## 2020-03-19 ENCOUNTER — Other Ambulatory Visit: Payer: Self-pay

## 2020-03-19 ENCOUNTER — Ambulatory Visit (HOSPITAL_COMMUNITY): Payer: Medicaid Other | Admitting: Anesthesiology

## 2020-03-19 ENCOUNTER — Encounter (HOSPITAL_COMMUNITY): Payer: Self-pay | Admitting: Gastroenterology

## 2020-03-19 ENCOUNTER — Encounter (HOSPITAL_COMMUNITY)
Admission: RE | Disposition: A | Discharge status: Home / Self Care | Payer: Self-pay | Source: Home / Self Care | Attending: Gastroenterology

## 2020-03-19 ENCOUNTER — Ambulatory Visit (HOSPITAL_COMMUNITY)
Admission: RE | Admit: 2020-03-19 | Discharge: 2020-03-19 | Disposition: A | Discharge status: Home / Self Care | Payer: Medicaid Other | Attending: Gastroenterology | Admitting: Gastroenterology

## 2020-03-19 DIAGNOSIS — K648 Other hemorrhoids: Secondary | ICD-10-CM | POA: Diagnosis not present

## 2020-03-19 DIAGNOSIS — Z79899 Other long term (current) drug therapy: Secondary | ICD-10-CM | POA: Diagnosis not present

## 2020-03-19 DIAGNOSIS — Z6841 Body Mass Index (BMI) 40.0 and over, adult: Secondary | ICD-10-CM | POA: Insufficient documentation

## 2020-03-19 DIAGNOSIS — E669 Obesity, unspecified: Secondary | ICD-10-CM | POA: Diagnosis not present

## 2020-03-19 DIAGNOSIS — Z1211 Encounter for screening for malignant neoplasm of colon: Secondary | ICD-10-CM | POA: Insufficient documentation

## 2020-03-19 DIAGNOSIS — Z7984 Long term (current) use of oral hypoglycemic drugs: Secondary | ICD-10-CM | POA: Diagnosis not present

## 2020-03-19 HISTORY — DX: Developmental disorder of scholastic skills, unspecified: F81.9

## 2020-03-19 HISTORY — PX: COLONOSCOPY WITH PROPOFOL: SHX5780

## 2020-03-19 LAB — GLUCOSE, CAPILLARY: Glucose-Capillary: 121 mg/dL — ABNORMAL HIGH (ref 70–99)

## 2020-03-19 SURGERY — COLONOSCOPY WITH PROPOFOL
Anesthesia: Monitor Anesthesia Care

## 2020-03-19 MED ORDER — MIDAZOLAM HCL 5 MG/5ML IJ SOLN
INTRAMUSCULAR | Status: DC | PRN
  Administered 2020-03-19 (×2): 1 mg via INTRAVENOUS

## 2020-03-19 MED ORDER — PROPOFOL 10 MG/ML IV BOLUS
INTRAVENOUS | Status: DC | PRN
  Administered 2020-03-19 (×3): 20 mg via INTRAVENOUS

## 2020-03-19 MED ORDER — PROPOFOL 10 MG/ML IV BOLUS
INTRAVENOUS | Status: AC
  Filled 2020-03-19: qty 20

## 2020-03-19 MED ORDER — SODIUM CHLORIDE 0.9 % IV SOLN: INTRAVENOUS | Status: DC

## 2020-03-19 MED ORDER — MIDAZOLAM HCL 2 MG/2ML IJ SOLN
INTRAMUSCULAR | Status: AC
  Filled 2020-03-19: qty 2

## 2020-03-19 MED ORDER — LACTATED RINGERS IV SOLN
INTRAVENOUS | Status: DC
  Administered 2020-03-19: 1000 mL via INTRAVENOUS

## 2020-03-19 MED ORDER — EPHEDRINE 5 MG/ML INJ
INTRAVENOUS | Status: AC
  Filled 2020-03-19: qty 20

## 2020-03-19 MED ORDER — PHENYLEPHRINE 40 MCG/ML (10ML) SYRINGE FOR IV PUSH (FOR BLOOD PRESSURE SUPPORT)
PREFILLED_SYRINGE | INTRAVENOUS | Status: AC
  Filled 2020-03-19: qty 20

## 2020-03-19 MED ORDER — PROPOFOL 500 MG/50ML IV EMUL
INTRAVENOUS | Status: DC | PRN
  Administered 2020-03-19: 100 ug/kg/min via INTRAVENOUS

## 2020-03-19 MED ORDER — LIDOCAINE 2% (20 MG/ML) 5 ML SYRINGE
INTRAMUSCULAR | Status: DC | PRN
  Administered 2020-03-19: 100 mg via INTRAVENOUS

## 2020-03-19 SURGICAL SUPPLY — 21 items

## 2020-03-19 NOTE — Anesthesia Postprocedure Evaluation (Signed)
Anesthesia Post Note  Patient: AANVI VOYLES  Procedure(s) Performed: COLONOSCOPY WITH PROPOFOL (N/A )     Patient location during evaluation: PACU Anesthesia Type: MAC Level of consciousness: awake and alert Pain management: pain level controlled Vital Signs Assessment: post-procedure vital signs reviewed and stable Respiratory status: spontaneous breathing and respiratory function stable Cardiovascular status: stable Postop Assessment: no apparent nausea or vomiting Anesthetic complications: no   No complications documented.  Last Vitals:  Vitals:   03/19/20 0930 03/19/20 0940  BP: 112/87 121/86  Pulse: 74 75  Resp: (!) 22 13  Temp:    SpO2: 97% 100%    Last Pain:  Vitals:   03/19/20 0940  TempSrc:   PainSc: 0-No pain                 Saima Monterroso DANIEL

## 2020-03-19 NOTE — Discharge Instructions (Signed)
YOU HAD AN ENDOSCOPIC PROCEDURE TODAY: Refer to the procedure report and other information in the discharge instructions given to you for any specific questions about what was found during the examination. If this information does not answer your questions, please call [facility] office at [phone] to clarify.   YOU SHOULD EXPECT: Some feelings of bloating in the abdomen. Passage of more gas than usual. Walking can help get rid of the air that was put into your GI tract during the procedure and reduce the bloating. If you had a lower endoscopy (such as a colonoscopy or flexible sigmoidoscopy) you may notice spotting of blood in your stool or on the toilet paper. Some abdominal soreness may be present for a day or two, also.  DIET: Your first meal following the procedure should be a light meal and then it is ok to progress to your normal diet. A half-sandwich or bowl of soup is an example of a good first meal. Heavy or fried foods are harder to digest and may make you feel nauseous or bloated. Drink plenty of fluids but you should avoid alcoholic beverages for 24 hours. If you had a esophageal dilation, please see attached instructions for diet.    ACTIVITY: Your care partner should take you home directly after the procedure. You should plan to take it easy, moving slowly for the rest of the day. You can resume normal activity the day after the procedure however YOU SHOULD NOT DRIVE, use power tools, machinery or perform tasks that involve climbing or major physical exertion for 24 hours (because of the sedation medicines used during the test).   SYMPTOMS TO REPORT IMMEDIATELY: A gastroenterologist can be reached at any hour. Please call [phone]  for any of the following symptoms:  Following lower endoscopy (colonoscopy, flexible sigmoidoscopy) Excessive amounts of blood in the stool  Significant tenderness, worsening of abdominal pains  Swelling of the abdomen that is new, acute  Fever of 100  or higher    FOLLOW UP:  If any biopsies were taken you will be contacted by phone or by letter within the next 1-3 weeks. Call [phone]  if you have not heard about the biopsies in 3 weeks.  Please also call with any specific questions about appointments or follow up tests. YOU HAD AN ENDOSCOPIC PROCEDURE TODAY: Refer to the procedure report and other information in the discharge instructions given to you for any specific questions about what was found during the examination. If this information does not answer your questions, please call [facility] office at [phone] to clarify.   YOU SHOULD EXPECT: Some feelings of bloating in the abdomen. Passage of more gas than usual. Walking can help get rid of the air that was put into your GI tract during the procedure and reduce the bloating. If you had a lower endoscopy (such as a colonoscopy or flexible sigmoidoscopy) you may notice spotting of blood in your stool or on the toilet paper. Some abdominal soreness may be present for a day or two, also.  DIET: Your first meal following the procedure should be a light meal and then it is ok to progress to your normal diet. A half-sandwich or bowl of soup is an example of a good first meal. Heavy or fried foods are harder to digest and may make you feel nauseous or bloated. Drink plenty of fluids but you should avoid alcoholic beverages for 24 hours. If you had a esophageal dilation, please see attached instructions for diet.      ACTIVITY: Your care partner should take you home directly after the procedure. You should plan to take it easy, moving slowly for the rest of the day. You can resume normal activity the day after the procedure however YOU SHOULD NOT DRIVE, use power tools, machinery or perform tasks that involve climbing or major physical exertion for 24 hours (because of the sedation medicines used during the test).   SYMPTOMS TO REPORT IMMEDIATELY: A gastroenterologist can be reached at any hour.  Please call [phone]  for any of the following symptoms:  Following lower endoscopy (colonoscopy, flexible sigmoidoscopy) Excessive amounts of blood in the stool  Significant tenderness, worsening of abdominal pains  Swelling of the abdomen that is new, acute  Fever of 100 or higher    FOLLOW UP:  If any biopsies were taken you will be contacted by phone or by letter within the next 1-3 weeks. Call [phone]  if you have not heard about the biopsies in 3 weeks.  Please also call with any specific questions about appointments or follow up tests.  

## 2020-03-19 NOTE — H&P (Signed)
[facility] Admission Note  Chief Complaint: colon cancer screening  HPI: Erika [name] is an 45 y.o. female.  Presents average-risk colon cancer screening.  No abdominal pain, blood in stools, change in bowel habits.  No known family history of colon cancer or polyps.  Past Medical History:  Diagnosis Date  . Anxiety   . Depression   . Diabetes (HCC)   . GERD (gastroesophageal reflux disease)   . GERD (gastroesophageal reflux disease)   . Hyperlipemia   . Hyperlipidemia   . Learning disability    Mother helps her with medical needs  . Obesity   . OSA treated with BiPAP     Past Surgical History:  Procedure Laterality Date  . ABDOMINAL HYSTERECTOMY    . CHOLECYSTECTOMY    . ESOPHAGOGASTRODUODENOSCOPY (EGD) WITH PROPOFOL N/A 07/07/2016   Procedure: ESOPHAGOGASTRODUODENOSCOPY (EGD) WITH PROPOFOL;  Surgeon: Carman Ching, MD;  Location: [facility];  Service: Endoscopy;  Laterality: N/A;    Medications Prior to Admission  Medication Sig Dispense Refill  . acetaminophen (TYLENOL) 500 MG tablet Take 1,000 mg by mouth every 6 (six) hours as needed for moderate pain or headache.    . ARIPiprazole (ABILIFY) 10 MG tablet Take 1 tablet (10 mg total) by mouth daily. 90 tablet 0  . atorvastatin (LIPITOR) 20 MG tablet Take 20 mg by mouth at bedtime.    . benazepril (LOTENSIN) 10 MG tablet Take 10 mg by mouth daily.    . cholecalciferol (VITAMIN D) 1000 units tablet Take 1,000 Units by mouth daily.    . Ertugliflozin-metFORMIN HCl (SEGLUROMET) 7.06-998 MG TABS Take 1 tablet by mouth daily.    . famotidine (PEPCID) 20 MG tablet Take 20 mg by mouth 3 (three) times daily.    Marland Kitchen ketoconazole (NIZORAL) 2 % cream Apply 1 application topically 2 (two) times daily.    Marland Kitchen loratadine (CLARITIN) 10 MG tablet Take 10 mg by mouth daily.    Marland Kitchen PAPAYA ENZYME PO Take 1 capsule by mouth 3 (three) times daily after meals.    . sertraline (ZOLOFT) 100 MG tablet Take 1.5 tablets (150 mg total) by  mouth daily. 135 tablet 0  . temazepam (RESTORIL) 15 MG capsule Take 1 capsule (15 mg total) by mouth at bedtime. 90 capsule 0  . Accu-Chek FastClix Lancets MISC USE TO CHECK GLUCOSE TWICE DAILY    . ACCU-CHEK GUIDE test strip USE 1 TEST STRIP TO TEST BLOOD GLUCOSE ONCE DAILY AS DIRECTED      Allergies:  Allergies  Allergen Reactions  . Haldol [Haloperidol Decanoate] Other (See Comments)    NMS (neuroleptic malignant syndrome) , 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      Family History  Problem Relation Age of Onset  . Hypertension Mother   . Hyperlipidemia Mother   . Diabetes Mother   . Breast cancer Mother 35  . Diabetes Father   . Hypertension Father     Social History:  reports that she has never smoked. She has never used smokeless tobacco. She reports that she does not drink alcohol and does not use drugs.   ROS: As per HPI, all others negative   Blood pressure 129/68, pulse 70, temperature 98.1 F (36.7 C), temperature source Oral, resp. rate 20, height 5\' 5"  (1.651 m), weight 111.1 kg, SpO2 99 %. General appearance: NAD, overweight NEURO:  Non-focal HEENT:  NCAT, anicteric ABD:  Protuberant, soft  Results for orders placed or performed during the hospital encounter of 03/19/20 (from the past 48 hour(s))  Glucose, capillary     Status: Abnormal   Collection Time: 03/19/20  7:46 AM  Result Value Ref Range   Glucose-Capillary 121 (H) 70 - 99 mg/dL    Comment: Glucose reference range applies only to samples taken after fasting for at least 8 hours.   No results found.  Assessment/Plan  1.  Colon cancer screening, average-risk. 2.  Obese, BMI ~ 50. 3.  Proceed with colonoscopy in hospital outpatient setting. 4.  Risks (bleeding, infection, bowel perforation that could require surgery, sedation-related changes in cardiopulmonary systems), benefits (identification and possible treatment of source of symptoms, exclusion of certain causes of symptoms), and alternatives (watchful waiting,  radiographic imaging studies, empiric medical treatment) of colonoscopy were explained to patient/family in detail and patient wishes to proceed.  Freddy Jaksch 03/19/2020, 8:43 AM

## 2020-03-19 NOTE — Transfer of Care (Signed)
Immediate Anesthesia Transfer of Care Note  Patient: Erika [name]  Procedure(s) Performed: COLONOSCOPY WITH PROPOFOL (N/A )  Patient Location: Endoscopy Unit  Anesthesia Type:MAC  Level of Consciousness: awake and alert   Airway & Oxygen Therapy: Patient Spontanous Breathing and Patient connected to face mask oxygen  Post-op Assessment: Report given to RN and Post -op Vital signs reviewed and stable  Post vital signs: Reviewed and stable  Last Vitals:  Vitals Value Taken Time  BP    Temp    Pulse    Resp    SpO2      Last Pain:  Vitals:   03/19/20 0729  TempSrc: Oral  PainSc: 0-No pain         Complications: No complications documented.

## 2020-03-19 NOTE — Op Note (Signed)
[name] Patient Name: Erika [name] Procedure Date: 03/19/2020 MRN: [id number] Attending MD: Willis ModenaWilliam Lilias Lorensen , MD Date of Birth: 02/25/1976 CSN: [id number] Age: [age] Admit Type: Outpatient Procedure:                Colonoscopy Indications:              Screening for colorectal malignant neoplasm, This                            is the patient's first colonoscopy Providers:                Willis ModenaWilliam Mery Guadalupe, MD, Bonney LeitzShanice Khonje, Michele McalpineErik Holloway                            Technician Referring MD:              Medicines:                Monitored Anesthesia Care Complications:            No immediate complications. Estimated Blood Loss:     Estimated blood loss: none. Procedure:                Pre-Anesthesia Assessment:                           - Prior to the procedure, a History and Physical                            was performed, and patient medications and                            allergies were reviewed. The patient's tolerance of                            previous anesthesia was also reviewed. The risks                            and benefits of the procedure and the sedation                            options and risks were discussed with the patient.                            All questions were answered, and informed consent                            was obtained. Prior Anticoagulants: The patient has                            taken no previous anticoagulant or antiplatelet                            agents. ASA Grade Assessment: III - A patient with                            severe systemic  disease. After reviewing the risks                            and benefits, the patient was deemed in                            satisfactory condition to undergo the procedure.                           After obtaining informed consent, the colonoscope                            was passed under direct vision. Throughout the                            procedure, the  patient's blood pressure, pulse, and                            oxygen saturations were monitored continuously. The                            CF-HQ190L ([id number]) Olympus colonoscope was                            introduced through the anus and advanced to the the                            cecum, identified by appendiceal orifice and                            ileocecal valve. The ileocecal valve, appendiceal                            orifice, and rectum were photographed. The entire                            colon was examined. The colonoscopy was performed                            without difficulty. The patient tolerated the                            procedure well. The quality of the bowel                            preparation was good. Scope In: 8:57:36 AM Scope Out: 9:12:06 AM Scope Withdrawal Time: 0 hours 8 minutes 38 seconds  Total Procedure Duration: 0 hours 14 minutes 30 seconds  Findings:      The perianal and digital rectal examinations were normal.      Internal hemorrhoids were found during retroflexion. The hemorrhoids       were mild.      No additional abnormalities were found on retroflexion.      Colon otherwise normal; no other polyps, masses, vascular ectasias, or  inflammatory changes were seen. Impression:               - Internal hemorrhoids.                           - The examination was otherwise normal. Moderate Sedation:      None Recommendation:           - Patient has a contact number available for                            emergencies. The signs and symptoms of potential                            delayed complications were discussed with the                            patient. Return to normal activities tomorrow.                            Written discharge instructions were provided to the                            patient.                           - Discharge patient to home (ambulatory).                           - High fiber diet  indefinitely.                           - Topical therapies (e.g., Preparation-H),                            avoidance of constipation/straining, liberal water                            intake, for management of hemorrhoids.                           - Continue present medications.                           - Return to GI clinic PRN.                           - Repeat colonoscopy in 10 years for screening                            purposes.                           - Return to referring physician as previously                            scheduled. Procedure Code(s):        --- Professional ---  26834, Colonoscopy, flexible; diagnostic, including                            collection of specimen(s) by brushing or washing,                            when performed (separate procedure) Diagnosis Code(s):        --- Professional ---                           Z12.11, Encounter for screening for malignant                            neoplasm of colon                           K64.8, Other hemorrhoids CPT copyright 2019 [employer]. All rights reserved. The codes documented in this report are preliminary and upon coder review may  be revised to meet current compliance requirements. Willis Modena, MD 03/19/2020 9:25:29 AM This report has been signed electronically. Number of Addenda: 0

## 2020-03-19 NOTE — Anesthesia Procedure Notes (Signed)
Date/Time: 03/19/2020 8:49 AM Performed by: Florene Route, CRNA Oxygen Delivery Method: Simple face mask

## 2020-06-02 ENCOUNTER — Other Ambulatory Visit: Payer: Self-pay

## 2020-06-02 ENCOUNTER — Encounter (HOSPITAL_COMMUNITY): Payer: Self-pay | Admitting: Psychiatry

## 2020-06-02 ENCOUNTER — Telehealth (INDEPENDENT_AMBULATORY_CARE_PROVIDER_SITE_OTHER): Payer: Medicaid Other | Admitting: Psychiatry

## 2020-06-02 DIAGNOSIS — F200 Paranoid schizophrenia: Secondary | ICD-10-CM | POA: Diagnosis not present

## 2020-06-02 DIAGNOSIS — F419 Anxiety disorder, unspecified: Secondary | ICD-10-CM | POA: Diagnosis not present

## 2020-06-02 MED ORDER — SERTRALINE HCL 100 MG PO TABS: 150.0000 mg | ORAL_TABLET | Freq: Every day | ORAL | 0 refills | Status: DC

## 2020-06-02 MED ORDER — ARIPIPRAZOLE 10 MG PO TABS: 10.0000 mg | ORAL_TABLET | Freq: Every day | ORAL | 0 refills | Status: DC

## 2020-06-02 MED ORDER — TEMAZEPAM 15 MG PO CAPS: 15.0000 mg | ORAL_CAPSULE | Freq: Every day | ORAL | 0 refills | Status: DC

## 2020-06-02 NOTE — Progress Notes (Signed)
Virtual Visit via Telephone Note  I connected with Erika [name] on 06/02/20 at  3:40 PM EDT by telephone and verified that I am speaking with the correct person using two identifiers.  Location: Patient: Home Provider: Home Office   I discussed the limitations, risks, security and privacy concerns of performing an evaluation and management service by telephone and the availability of in person appointments. I also discussed with the patient that there may be a patient responsible charge related to this service. The patient expressed understanding and agreed to proceed.   History of Present Illness: Patient is evaluated by phone session.  Her mother is also available on the phone.  Patient is doing much better with the medication.  Now she is working 5 days a week and she is more active.  She lost another 15 pounds since the last visit because she is walking and watching her caloric intake.  She is sleeping better with the CPAP.  She has upcoming appointment with her PCP on April 29 for blood work.  She denies any paranoia, hallucination or any trust issues.  She does not want to change the medication and her mother also agreed that patient is doing well and she is reluctant to change in the current regimen.  She has no tremors shakes or any EPS.  She denies drinking or using any illegal substances.  She denies any panic attack, nervousness or any anxiety attack.   Past Psychiatric History: H/Opsychosissinceteens. [facility] and noncompliant with medication.H/O taking multiplemedswhich gradually reduced and discontinued.No h/osuicidal attempt.    Psychiatric Specialty Exam: Physical Exam  Review of Systems  Weight 245 lb (111.1 kg).There is no height or weight on file to calculate BMI.  General Appearance: NA  Eye Contact:  NA  Speech:  Slow  Volume:  Decreased  Mood:  Euthymic  Affect:  NA  Thought Process:  Goal Directed  Orientation:   Full (Time, Place, and Person)  Thought Content:  WDL  Suicidal Thoughts:  No  Homicidal Thoughts:  No  Memory:  Immediate;   Good Recent;   Fair Remote;   Fair  Judgement:  Intact  Insight:  Present  Psychomotor Activity:  NA  Concentration:  Concentration: Fair and Attention Span: Fair  Recall:  Good  Fund of Knowledge:  Fair  Language:  Good  Akathisia:  No  Handed:  Right  AIMS (if indicated):     Assets:  Communication Skills Desire for Improvement Housing Resilience Social Support Transportation  ADL's:  Intact  Cognition:  WNL  Sleep:   ok with CPAP      Assessment and Plan: Schizophrenia chronic paranoid type.  Anxiety.  Patient is stable on the current medication.  I offered to cut down the medication but patient and his mother both agreed to keep on the current medicine as patient doing very well.  She continues to lose weight as more active and walking.  She lost weight gradually and she has more energy.  I encouraged to keep appointment with the PCP under control at night for blood work.  Recommended to call us back if is any question or any concern.  Continue Zoloft 150 mg daily, temazepam 15 mg at bedtime and Abilify 10 mg daily.  Follow-up in 3 months.  Follow Up Instructions:    I discussed the assessment and treatment plan with the patient. The patient was provided an opportunity to ask questions and all were answered. The patient agreed with  the plan and demonstrated an understanding of the instructions.   The patient was advised to call back or seek an in-person evaluation if the symptoms worsen or if the condition fails to improve as anticipated.  I provided 12 minutes of non-face-to-face time during this encounter.   Cleotis Nipper, MD

## 2020-06-09 ENCOUNTER — Other Ambulatory Visit (HOSPITAL_COMMUNITY): Payer: Self-pay | Admitting: Psychiatry

## 2020-06-09 DIAGNOSIS — F200 Paranoid schizophrenia: Secondary | ICD-10-CM

## 2020-07-13 ENCOUNTER — Other Ambulatory Visit (HOSPITAL_COMMUNITY): Payer: Self-pay | Admitting: Psychiatry

## 2020-07-13 DIAGNOSIS — F419 Anxiety disorder, unspecified: Secondary | ICD-10-CM

## 2020-07-13 DIAGNOSIS — F200 Paranoid schizophrenia: Secondary | ICD-10-CM

## 2020-09-02 ENCOUNTER — Encounter (HOSPITAL_COMMUNITY): Payer: Self-pay | Admitting: Psychiatry

## 2020-09-02 ENCOUNTER — Other Ambulatory Visit: Payer: Self-pay

## 2020-09-02 ENCOUNTER — Telehealth (INDEPENDENT_AMBULATORY_CARE_PROVIDER_SITE_OTHER): Payer: Medicaid Other | Admitting: Psychiatry

## 2020-09-02 DIAGNOSIS — F419 Anxiety disorder, unspecified: Secondary | ICD-10-CM

## 2020-09-02 DIAGNOSIS — F200 Paranoid schizophrenia: Secondary | ICD-10-CM | POA: Diagnosis not present

## 2020-09-02 MED ORDER — TEMAZEPAM 15 MG PO CAPS: 15.0000 mg | ORAL_CAPSULE | Freq: Every day | ORAL | 0 refills | Status: DC

## 2020-09-02 MED ORDER — ARIPIPRAZOLE 10 MG PO TABS: 10.0000 mg | ORAL_TABLET | Freq: Every day | ORAL | 0 refills | Status: DC

## 2020-09-02 MED ORDER — SERTRALINE HCL 100 MG PO TABS: 150.0000 mg | ORAL_TABLET | Freq: Every day | ORAL | 0 refills | Status: DC

## 2020-09-02 NOTE — Progress Notes (Signed)
Virtual Visit via Telephone Note  I connected with Erika [name] on 09/02/20 at  3:20 PM EDT by telephone and verified that I am speaking with the correct person using two identifiers.  Location: Patient: home  Provider: home office   I discussed the limitations, risks, security and privacy concerns of performing an evaluation and management service by telephone and the availability of in person appointments. I also discussed with the patient that there may be a patient responsible charge related to this service. The patient expressed understanding and agreed to proceed.   History of Present Illness: Patient is evaluated by phone session.  Her mother was also present on the phone.  Patient and her mother told that patient is doing well.  She is continue to work 5 days a week and she is more active.  She had a visit with her PCP Dr. Ludwig Clarks in April and she was told everything is good and she actually lost few pounds from the past.  However she does not have exact numbers.  She is sleeping good.  She denies any irritability, mania, anger, hallucination or any paranoia.  She has no tremors or shakes.  She uses CPAP which helps her sleep 7 to 8 hours.  Patient has no concern from the medication.  She denies any panic attack and does not feel overwhelmed.  She is able to go outside without any anxiety.   Past Psychiatric History:  H/O psychosis since teens. H/O at least 3 inpatient at [facility] due to paranoia and noncompliant with medication. H/O taking multiple meds which gradually reduced and discontinued. No h/o suicidal attempt.     Psychiatric Specialty Exam: Physical Exam  Review of Systems  There were no vitals taken for this visit.There is no height or weight on file to calculate BMI.  General Appearance: NA  Eye Contact:  NA  Speech:  Slow  Volume:  Decreased  Mood:  Euthymic  Affect:  NA  Thought Process:  Goal Directed  Orientation:  Full (Time, Place, and Person)  Thought Content:   WDL  Suicidal Thoughts:  No  Homicidal Thoughts:  No  Memory:  Immediate;   Good Recent;   Fair Remote;   Fair  Judgement:  Intact  Insight:  Present  Psychomotor Activity:  NA  Concentration:  Concentration: Fair and Attention Span: Fair  Recall:  [employer] of Knowledge:  Fair  Language:  Good  Akathisia:  No  Handed:  Right  AIMS (if indicated):     Assets:  Communication Skills Desire for Improvement Housing Social Support Talents/Skills  ADL's:  Intact  Cognition:  WNL  Sleep:   ok with cpap      Assessment and Plan: Schizophrenia chronic paranoid type.  Anxiety.  Patient is stable on her current medication.  We will get records from her PCP.  Continue Zoloft 150 mg daily, temazepam 15 mg at bedtime and Abilify 10 mg daily.  Recommended to call us back if she has any question or any concern.  Follow-up in 3 months.  Follow Up Instructions:    I discussed the assessment and treatment plan with the patient. The patient was provided an opportunity to ask questions and all were answered. The patient agreed with the plan and demonstrated an understanding of the instructions.   The patient was advised to call back or seek an in-person evaluation if the symptoms worsen or if the condition fails to improve as anticipated.  I provided 16 minutes of  non-face-to-face time during this encounter.   Kathlee Nations, MD

## 2020-09-09 ENCOUNTER — Other Ambulatory Visit (HOSPITAL_COMMUNITY): Payer: Self-pay | Admitting: Psychiatry

## 2020-09-09 DIAGNOSIS — F200 Paranoid schizophrenia: Secondary | ICD-10-CM

## 2020-09-09 DIAGNOSIS — F419 Anxiety disorder, unspecified: Secondary | ICD-10-CM

## 2020-09-10 ENCOUNTER — Telehealth (HOSPITAL_COMMUNITY): Payer: Self-pay

## 2020-09-10 NOTE — Telephone Encounter (Signed)
[facility] TRACKS PRESCRIPTION COVERAGE APPROVED  ARIPIPRAZOLE 10MG  TABLET PA# EFFECTIVE 09/09/2020 TO 09/04/2021  S/W [facility] REFERENCE # [facility]  NOTIFIED PHARMACY. PT PICKED UP MEDICATION

## 2020-10-13 ENCOUNTER — Other Ambulatory Visit (HOSPITAL_COMMUNITY): Payer: Self-pay | Admitting: Psychiatry

## 2020-10-13 DIAGNOSIS — F419 Anxiety disorder, unspecified: Secondary | ICD-10-CM

## 2020-10-13 DIAGNOSIS — F200 Paranoid schizophrenia: Secondary | ICD-10-CM

## 2020-11-05 ENCOUNTER — Other Ambulatory Visit: Payer: Self-pay | Admitting: Internal Medicine

## 2020-11-05 DIAGNOSIS — Z1231 Encounter for screening mammogram for malignant neoplasm of breast: Secondary | ICD-10-CM

## 2020-12-03 ENCOUNTER — Telehealth (HOSPITAL_BASED_OUTPATIENT_CLINIC_OR_DEPARTMENT_OTHER): Payer: Medicaid Other | Admitting: Psychiatry

## 2020-12-03 ENCOUNTER — Encounter (HOSPITAL_COMMUNITY): Payer: Self-pay | Admitting: Psychiatry

## 2020-12-03 ENCOUNTER — Other Ambulatory Visit: Payer: Self-pay

## 2020-12-03 DIAGNOSIS — F419 Anxiety disorder, unspecified: Secondary | ICD-10-CM

## 2020-12-03 DIAGNOSIS — F200 Paranoid schizophrenia: Secondary | ICD-10-CM

## 2020-12-03 MED ORDER — TEMAZEPAM 15 MG PO CAPS
15.0000 mg | ORAL_CAPSULE | Freq: Every day | ORAL | 0 refills | Status: DC
Start: 2020-12-03 — End: 2021-03-05

## 2020-12-03 MED ORDER — SERTRALINE HCL 100 MG PO TABS: 150.0000 mg | ORAL_TABLET | Freq: Every day | ORAL | 0 refills | Status: DC

## 2020-12-03 MED ORDER — ARIPIPRAZOLE 10 MG PO TABS: 10.0000 mg | ORAL_TABLET | Freq: Every day | ORAL | 0 refills | Status: DC

## 2020-12-03 NOTE — Progress Notes (Signed)
Virtual Visit via Telephone Note  I connected with Erika [name] on 12/03/20 at  3:00 PM EDT by telephone and verified that I am speaking with the correct person using two identifiers.  Location: Patient: Home Provider: Home Office   I discussed the limitations, risks, security and privacy concerns of performing an evaluation and management service by telephone and the availability of in person appointments. I also discussed with the patient that there may be a patient responsible charge related to this service. The patient expressed understanding and agreed to proceed.   History of Present Illness: Patient is evaluated by phone session.  She is taking all her medication and continues to work 5 days a week 4 hours and she feels good about it.  She reported summer was busy and she was working during the summer.  Her mother recently diagnosed with cancer she was also present on the phone but she was not in good spirits.  Patient does not want to change the medication since her paranoia, anxiety and depression is a stable.  She sleeps 7 to 8 hours with the help of CPAP.  Her last blood work was done 2 months ago and her hemoglobin A1c is 6.9.  Her PCP started her on Farxiga and metformin and had another blood work coming up in November.  Patient has no tremor or shakes or any EPS.  Past Psychiatric History:  H/O psychosis since teens. H/O at least 3 inpatient at [facility] due to paranoia and noncompliant with medication. H/O taking multiple meds which gradually reduced and discontinued. No h/o suicidal attempt.     Psychiatric Specialty Exam: Physical Exam  Review of Systems  Weight 260 lb (117.9 kg).There is no height or weight on file to calculate BMI.  General Appearance: NA  Eye Contact:  NA  Speech:  Slow  Volume:  Decreased  Mood:  Euthymic  Affect:  NA  Thought Process:  Descriptions of Associations: Intact  Orientation:  Full (Time, Place, and Person)  Thought Content:  Logical   Suicidal Thoughts:  No  Homicidal Thoughts:  No  Memory:  Immediate;   Fair Recent;   Fair Remote;   Fair  Judgement:  Intact  Insight:  Present  Psychomotor Activity:  NA  Concentration:  Concentration: Fair and Attention Span: Fair  Recall:  [employer] of Knowledge:  Fair  Language:  Good  Akathisia:  No  Handed:  Right  AIMS (if indicated):     Assets:  Communication Skills Desire for Improvement Housing Resilience Social Support  ADL's:  Intact  Cognition:  WNL  Sleep:   ok with cpap      Assessment and Plan: Schizophrenia chronic paranoid type.  Anxiety.  Patient is stable on her current medication.  I reviewed the blood work results which was giving to the patient by her PCP but she also having another blood work in November.  I recommend to have [place] call with the hemoglobin A1c results.  Continue Zoloft and 50 mg daily, temazepam 15 mg at bedtime and Abilify 10 mg daily.  Patient and his mother does not want to change the medication.  Recommended to call us back if she has any question or any concern.  Follow-up in 3 months.  Follow Up Instructions:    I discussed the assessment and treatment plan with the patient. The patient was provided an opportunity to ask questions and all were answered. The patient agreed with the plan and demonstrated an understanding of  the instructions.   The patient was advised to call back or seek an in-person evaluation if the symptoms worsen or if the condition fails to improve as anticipated.  I provided 16 minutes of non-face-to-face time during this encounter.   Cleotis Nipper, MD

## 2020-12-12 ENCOUNTER — Other Ambulatory Visit: Payer: Self-pay

## 2020-12-12 ENCOUNTER — Ambulatory Visit
Admission: RE | Admit: 2020-12-12 | Discharge: 2020-12-12 | Disposition: A | Discharge status: Home / Self Care | Payer: Medicaid Other | Source: Ambulatory Visit | Attending: Internal Medicine | Admitting: Internal Medicine

## 2020-12-12 DIAGNOSIS — Z1231 Encounter for screening mammogram for malignant neoplasm of breast: Secondary | ICD-10-CM

## 2021-01-06 ENCOUNTER — Ambulatory Visit: Payer: Medicaid Other | Admitting: Family Medicine

## 2021-01-08 NOTE — Progress Notes (Signed)
PATIENT: Erika [name] DOB: 1975/12/30  REASON FOR VISIT: follow up HISTORY FROM: patient  Chief Complaint  Patient presents with   Obstructive Sleep Apnea    Rm 2, w [name]. Here for yearly BiPAP f/u. Pt reports doing well. No issues or concerns. Per pt at last eye exam it was noticed that the bulging in her R eye has reduced since starting CPAP.       HISTORY OF PRESENT ILLNESS: 01/13/21 ALL:  Erika [name] returns for follow up for OSA on BiPAP. She continues to do very well on therapy. She is using machine every night. She reports sleeping very well. She has not noted any concerns of leak. She feels mask is comfortable and fits well. She denies concerns. She received a great report with last eye exam.     01/08/2020 ALL:  Erika [name] is a 45 y.o. female here today for follow up for OSA on BiPAP.  She is doing very well on BiPAP therapy. She presents today with her [name] who states that she is sleeping very well. They are excited that she is doing so well. She wakes feeling refreshed. She is able to work without becoming sleepy.   Compliance report dated 12/09/2019 through 01/07/2020 reveals that she used BiPAP 29 of the past 30 days for compliance of 97%.  She used BiPAP greater than 4 hours all 29 days.  Average usage was 7 hours and 13 minutes.  Residual AHI was 4.1 with IPAP of 18 and EPAP of 14 cm water pressure.  Respiratory rate of 10.  There was a large leak noted in the 95th percentile of 76.6 L/min.  HISTORY: (copied from Dr Dohmeier's note on 11/09/2018)  11-09-2018 Erika [name] is a 45 y.o. female with BiPAP treatment and MRDD , seen here as in a referral/ revisit  from Dr. Verl Dicker Partner , Dr. Rosemary Holms.  Patient was last seen 1 year ago, by Darrol Angel, NP. In the meantime she had a bite guard made, which helped the patient with bruxism prevention but also seems to have had a positive effect on her breathing in conjunction with BiPAP 1 of many  risk factors  has been her weight and her current BMI is 46.35.  However she has been a very compliant user of BiPAP for the treatment of obstructive sleep apnea and has no issues with the machine. Dr. Hulen Skains is her dentist, Erika [name]  has continued to be highly compliant she has used the machine 29 of the last 30 days including 08 November 2018.   Average user time is 6 hours and 27 minutes.  Her inspiratory pressure is 18 expiratory pressure 14 cmH2O and she uses a background SVT rate of 10 bpm so rate 10 breaths/min.  Residual AHI is 4.9 which is fine and she does have some major air leaks but the have been addressed with a new mask.   The AHI varies greatly from night to night.  The respiratory rate however states constant.  I do not need to make any adjustments in settings.    Interval history from 04 May 2017, I have the pleasure of meeting with Erika [name] and her [name] today for the first time since her sleep studies were performed.  The patient underwent a split-night polysomnography on 24 December 2016 and the study confirmed that he has a high degree of apnea with an AHI of 44.4/h of sleep.  This means that she has an apnea about  every 80 seconds.  She spent all night in supine position so we could not determine if there was a positional component, and she did not reach REM sleep either.  She did not have significant low oxygen saturations none of prolonged duration however.  She was started on CPAP first was 5 cmH2O and was step-by-step titrated to 15 CPAP caused central apneas to emerge and did not reduce the overall apnea index.  The patient was therefore changed briefly to BiPAP but at that time the night was almost over.  Pressures of 15/10 centimeter and 17/12 centimeters were tried.  We asked the patient to return for a full night BiPAP titration based on these limited results.  She also developed some periodic limb movements at night but were not present in her first study.  She returned for a  full night titration on 30 November, BiPAP was now initiated at a low pressure and slowly advanced.  The first pressure was 8/5 cmH2O and she finally reached 17/13 cmH2O but her AHI was reduced to 3.8 apneas per hour of sleep from previously over 44.  Snoring also was alleviated at that higher pressure.  She was placed on a full facemask which I had initially not wanted but he tolerated a small size Simplus fullface mask very well.    Erika [name] is actually more thrilled than the patient about the results.  She no longer hears her daughter snoring or have apnea, the machine is very quiet and it has improved her own sleep significantly.  Erika [name] has been very compliant she has used the machine over 4 hours nightly for 26 out of the last 30 days, and she has used it on every of the last 30 days.  Average use of time is 5 hours and 49 minutes, the machine is set at 18/14 cmH2O BiPAP with a background respiratory rate of 10 breaths/min, her residual AHI is 0.0.  There are no more apneas.  She does have some high air leaks but these do not affect her sleep quality or the apnea control.  If troponin once I would change her to a nasal pillow or nasal mask.   Consult : She is according to her [name]'s observation snoring very loudly, for many years. She gained weight over the last 5 years, partially attributed to depression. The patient has a long psychiatric history, has a learning disability( Dr. Lolly Mustache)  She Gained weight over the last years due to psychotropic medication for the treatment of depression, she also has developed hypertension, and she was witnessed to have apnea after a medical procedure, but she cannot remember what kind of procedures this was. Sleep habits are as follows: Erika [name] usually watches TV for the last hours of the day before she goes to bed. Her bedtime is around 10 PM, and she retreats to her bedroom at this time. Her bedroom is cool quiet and dark. She sleeps on her side.  She avoids the right side because of an ophthalmological condition. She wears a mask for eye protection to sleep. She wakes up frequently to go to the bathroom at night at least 2 times. She rises in the morning and 5:15. Usually she has got 6 hours of sleep by that time. She feels usually rested and restored in the morning but also reports a dry mouth. She likes to chew gum to give her relief from the dry mouth. She also drinks a lot of water during the day, but has to  be reminded.    Sleep medical history and family sleep history: There is no other known family history of sleep apnea, but Erika [name] has been observed reading irregular in her sleep. She has diabetes, HTN, super obesity. Abnormal EKG- "flutter " .  She is on a sleep walker, has no history of night terrors or enuresis. She never underwent tonsillectomy and has no history of neck surgery or trauma.   Social history: Erika [name] lives with her parents, she is usually the first one in bed and rises without resistance and the morning. She goes to an adult enrichment center, and enjoys it. She does not use tobacco in any form, she does not drink alcohol, caffeine use she does drink coffee, soda and ice tea. She does not drink more than 1 or 2 cups of caffeinated beverage a day. Mostly soda.   REVIEW OF SYSTEMS: Out of a complete 14 system review of symptoms, the patient complains only of the following symptoms, none and all other reviewed systems are negative.  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     ARIPiprazole (ABILIFY) 10 MG tablet Take 1 tablet (10 mg total) by mouth daily. 90 tablet 0   atorvastatin (LIPITOR) 20 MG tablet Take 20 mg by mouth at bedtime.     benazepril (LOTENSIN) 10 MG tablet Take 10 mg by mouth daily.     cholecalciferol (VITAMIN D) 1000 units tablet Take 1,000 Units by mouth daily.     famotidine (PEPCID) 20 MG tablet Take 20 mg by mouth 3 (three) times daily.     FARXIGA 10 MG TABS tablet Take 10 mg by mouth every morning.     ketoconazole (NIZORAL) 2 % cream Apply 1 application topically 2 (two) times daily.     loratadine (CLARITIN) 10 MG tablet Take 10 mg by mouth daily.     metFORMIN (GLUCOPHAGE) 1000 MG tablet 1,000 mg in the morning and at bedtime.     PAPAYA ENZYME PO Take 1 capsule by mouth 3 (three) times daily after meals.     sertraline (ZOLOFT) 100 MG tablet Take 1.5 tablets (150 mg total) by mouth daily. 135 tablet 0   temazepam (RESTORIL) 15 MG capsule Take 1 capsule (15 mg total) by mouth at bedtime. 90 capsule 0   No facility-administered medications prior to visit.    PAST MEDICAL HISTORY: Past Medical History:  Diagnosis Date   Anxiety    Depression    Diabetes (HCC)    GERD (gastroesophageal reflux disease)    GERD (gastroesophageal reflux disease)    Hyperlipemia    Hyperlipidemia    Learning disability    [name] helps her with medical needs   Obesity    OSA treated with BiPAP     PAST SURGICAL HISTORY: Past Surgical History:  Procedure Laterality Date   ABDOMINAL HYSTERECTOMY     CHOLECYSTECTOMY     COLONOSCOPY WITH PROPOFOL N/A 03/19/2020  Procedure: COLONOSCOPY WITH PROPOFOL;  Surgeon: Willis Modena, MD;  Location: [facility];  Service: Endoscopy;  Laterality: N/A;   ESOPHAGOGASTRODUODENOSCOPY (EGD) WITH PROPOFOL N/A 07/07/2016   Procedure: ESOPHAGOGASTRODUODENOSCOPY (EGD) WITH PROPOFOL;  Surgeon: Carman Ching, MD;  Location: [facility];   Service: Endoscopy;  Laterality: N/A;    FAMILY HISTORY: Family History  Problem Relation Age of Onset   Hypertension [name]    Hyperlipidemia [name]    Diabetes [name]    Breast cancer [name] 66   Diabetes Father    Hypertension Father     SOCIAL HISTORY: Social History   Socioeconomic History   Marital status: Single    Spouse name: Not on file   Number of children: Not on file   Years of education: Not on file   Highest education level: Not on file  Occupational History   Not on file  Tobacco Use   Smoking status: Never   Smokeless tobacco: Never  Vaping Use   Vaping Use: Never used  Substance and Sexual Activity   Alcohol use: No    Alcohol/week: 0.0 standard drinks   Drug use: No   Sexual activity: Not Currently    Birth control/protection: Surgical  Other Topics Concern   Not on file  Social History Narrative   Lives with her mom   Social Determinants of Health   Financial Resource Strain: Not on file  Food Insecurity: Not on file  Transportation Needs: Not on file  Physical Activity: Not on file  Stress: Not on file  Social Connections: Not on file  Intimate Partner Violence: Not on file     PHYSICAL EXAM  Vitals:   01/13/21 0807  BP: 116/88  Pulse: 86  Weight: 260 lb (117.9 kg)  Height: 5\' 5"  (1.651 m)    Body mass index is 43.27 kg/m.  Generalized: Well developed, in no acute distress  Cardiology: normal rate and rhythm, no murmur noted Respiratory: clear to auscultation bilaterally  Neurological examination  Mentation: Alert oriented to time, place, history taking. Follows all commands speech and language fluent Cranial nerve II-XII: Pupils were equal round reactive to light. Extraocular movements were full, visual field were full  Motor: The motor testing reveals 5 over 5 strength of all 4 extremities. Good symmetric motor tone is noted throughout.  Gait and station: Gait is normal.    DIAGNOSTIC DATA (LABS, IMAGING, TESTING) - I  reviewed patient records, labs, notes, testing and imaging myself where available.  No flowsheet data found.   No results found for: WBC, HGB, HCT, MCV, PLT No results found for: NA, K, CL, CO2, GLUCOSE, BUN, CREATININE, CALCIUM, PROT, ALBUMIN, AST, ALT, ALKPHOS, BILITOT, GFRNONAA, GFRAA No results found for: CHOL, HDL, LDLCALC, LDLDIRECT, TRIG, CHOLHDL No results found for: No results found for: VITAMINB12 No results found for: TSH   ASSESSMENT AND PLAN 45 y.o. year old female  has a past medical history of Anxiety, Depression, Diabetes (HCC), GERD (gastroesophageal reflux disease), GERD (gastroesophageal reflux disease), Hyperlipemia, Hyperlipidemia, Learning disability, Obesity, and OSA treated with BiPAP. here with     ICD-10-CM   1. OSA treated with BiPAP  G47.33 For home use only DME Bipap    For home use only DME Bipap        Erika [name] is doing well on BiPAP therapy. She does continue to have a large leak, however, AHI is ar goal. I have educated her and her [name] of ways to monitor for and correct leak at home.  I will ask DME to check on her mask fit and reeducation as needed on how to monitor for leak. I will reassess download in 2 months. She was encouraged to continue using BiPAP nightly and for greater than 4 hours each night. We will update supply orders as indicated. Risks of untreated sleep apnea review and education materials provided. Healthy lifestyle habits encouraged. She will follow up in 1 year, sooner if needed. She verbalizes understanding and agreement with this plan.    Orders Placed This Encounter  Procedures   For home use only DME Bipap    Mask refitting    Order Specific Question:   Length of Need    Answer:   Lifetime    Order Specific Question:   Inspiratory pressure    Answer:   OTHER SEE COMMENTS    Order Specific Question:   Expiratory pressure    Answer:   OTHER SEE COMMENTS   For home use only DME Bipap    Supplies    Order  Specific Question:   Length of Need    Answer:   Lifetime    Order Specific Question:   Inspiratory pressure    Answer:   OTHER SEE COMMENTS    Order Specific Question:   Expiratory pressure    Answer:   OTHER SEE COMMENTS      No orders of the defined types were placed in this encounter.    Shawnie Dapper, FNP-C 01/13/2021, 8:29 AM [facility] [address], [address] [place], [place] [address] [phone]

## 2021-01-08 NOTE — Patient Instructions (Signed)
Please continue using your BiPAP regularly. While your insurance requires that you use BiPAP at least 4 hours each night on 70% of the nights, I recommend, that you not skip any nights and use it throughout the night if you can. Getting used to BiPAP and staying with the treatment long term does take time and patience and discipline. Untreated obstructive sleep apnea when it is moderate to severe can have an adverse impact on cardiovascular health and raise her risk for heart disease, arrhythmias, hypertension, congestive heart failure, stroke and diabetes. Untreated obstructive sleep apnea causes sleep disruption, nonrestorative sleep, and sleep deprivation. This can have an impact on your day to day functioning and cause daytime sleepiness and impairment of cognitive function, memory loss, mood disturbance, and problems focussing. Using BiPAP regularly can improve these symptoms.  Check leak at home. Make sure headgear is snug. I will ask DME to check leak as well. Call me with any concerns. I will reassess download to evaluate leak in 2-3 months. I will call you if we need to do anything different.   Follow up in 1 year

## 2021-01-13 ENCOUNTER — Other Ambulatory Visit: Payer: Self-pay

## 2021-01-13 ENCOUNTER — Ambulatory Visit: Payer: Medicaid Other | Admitting: Family Medicine

## 2021-01-13 ENCOUNTER — Encounter: Payer: Self-pay | Admitting: Family Medicine

## 2021-01-13 VITALS — BP 116/88 | HR 86 | Ht 65.0 in | Wt 260.0 lb

## 2021-01-13 DIAGNOSIS — G4733 Obstructive sleep apnea (adult) (pediatric): Secondary | ICD-10-CM

## 2021-01-13 NOTE — Progress Notes (Signed)
CM sent to [facility] 

## 2021-01-21 ENCOUNTER — Telehealth (HOSPITAL_COMMUNITY): Payer: Self-pay | Admitting: *Deleted

## 2021-01-21 NOTE — Telephone Encounter (Signed)
Lab results ( CMP with EGFR, HgbA1C, Lipid panel ) received from [facility], ordered by Dr. Fabiola Backer resulted on 01/16/21. All labs WNL with the exception of HDL low @ 36, Glucose high @ 107, and HgbA1C high @ 7.0. lab results to be scanned to pt chart.

## 2021-03-05 ENCOUNTER — Telehealth (HOSPITAL_BASED_OUTPATIENT_CLINIC_OR_DEPARTMENT_OTHER): Payer: Medicaid Other | Admitting: Psychiatry

## 2021-03-05 ENCOUNTER — Encounter (HOSPITAL_COMMUNITY): Payer: Self-pay | Admitting: Psychiatry

## 2021-03-05 ENCOUNTER — Other Ambulatory Visit: Payer: Self-pay

## 2021-03-05 DIAGNOSIS — F419 Anxiety disorder, unspecified: Secondary | ICD-10-CM | POA: Diagnosis not present

## 2021-03-05 DIAGNOSIS — F200 Paranoid schizophrenia: Secondary | ICD-10-CM | POA: Diagnosis not present

## 2021-03-05 MED ORDER — SERTRALINE HCL 100 MG PO TABS: 150.0000 mg | ORAL_TABLET | Freq: Every day | ORAL | 0 refills | Status: DC

## 2021-03-05 MED ORDER — TEMAZEPAM 15 MG PO CAPS: 15.0000 mg | ORAL_CAPSULE | Freq: Every day | ORAL | 0 refills | Status: DC

## 2021-03-05 MED ORDER — ARIPIPRAZOLE 10 MG PO TABS: 10.0000 mg | ORAL_TABLET | Freq: Every day | ORAL | 0 refills | Status: DC

## 2021-03-05 NOTE — Progress Notes (Signed)
Virtual Visit via Telephone Note  I connected with Erika [name] on 03/05/21 at  3:00 PM EST by telephone and verified that I am speaking with the correct person using two identifiers.  Location: Patient: Home Provider: Office   I discussed the limitations, risks, security and privacy concerns of performing an evaluation and management service by telephone and the availability of in person appointments. I also discussed with the patient that there may be a patient responsible charge related to this service. The patient expressed understanding and agreed to proceed.   History of Present Illness: Patient is evaluated by phone session.  She had a quite good Christmas.  She continues to work 5 days a week for hours and she feels good about it.  As per mother her supervisor is very happy with the progress she is making.  She also had a visit with her PCP.  She had lost 10 pounds and her blood sugar is much better.  Patient denies any paranoia, hallucination, crying spells.  She is sleeping good.  She uses CPAP that helps her sleep.  Patient has no tremor or shakes or any EPS.  She denies any panic attack or any crying spells.  She like to keep the current medication.  Past Psychiatric History:  H/O psychosis since teens. H/O at least 3 inpatient at [facility] due to paranoia and noncompliant with medication. H/O taking multiple meds which gradually reduced and discontinued. No h/o suicidal attempt.     Psychiatric Specialty Exam: Physical Exam  Review of Systems  Weight 250 lb (113.4 kg).There is no height or weight on file to calculate BMI.  General Appearance: NA  Eye Contact:  NA  Speech:  Slow  Volume:  Decreased  Mood:  Euthymic  Affect:  NA  Thought Process:  Descriptions of Associations: Intact  Orientation:  Full (Time, Place, and Person)  Thought Content:  Logical  Suicidal Thoughts:  No  Homicidal Thoughts:  No  Memory:  Immediate;   Fair Recent;   Fair Remote;   Fair  Judgement:   Intact  Insight:  Present  Psychomotor Activity:  NA  Concentration:  Concentration: Fair and Attention Span: Fair  Recall:  [employer] of Knowledge:  Good  Language:  Good  Akathisia:  No  Handed:  Right  AIMS (if indicated):     Assets:  Communication Skills Desire for Improvement Housing Social Support  ADL's:  Intact  Cognition:  WNL  Sleep:   ok      Assessment and Plan: Schizophrenia chronic paranoid type.  Anxiety.  I reviewed blood work results.  Last hemoglobin A1c is 7.  She also lost 10 pounds since last visit.  Patient is stable on her current medication.  Continue temazepam 15 mg at bedtime, Abilify 10 mg daily, Zoloft 150 mg daily.  Recommended to call us back if she is any question or any concern.  Follow-up in 3 months.  Follow Up Instructions:    I discussed the assessment and treatment plan with the patient. The patient was provided an opportunity to ask questions and all were answered. The patient agreed with the plan and demonstrated an understanding of the instructions.   The patient was advised to call back or seek an in-person evaluation if the symptoms worsen or if the condition fails to improve as anticipated.  I provided 23 minutes of non-face-to-face time during this encounter.   Kathlee Nations, MD

## 2021-03-10 ENCOUNTER — Other Ambulatory Visit (HOSPITAL_COMMUNITY): Payer: Self-pay | Admitting: Psychiatry

## 2021-03-10 DIAGNOSIS — F200 Paranoid schizophrenia: Secondary | ICD-10-CM

## 2021-04-06 ENCOUNTER — Other Ambulatory Visit (HOSPITAL_COMMUNITY): Payer: Self-pay | Admitting: Psychiatry

## 2021-04-06 DIAGNOSIS — F200 Paranoid schizophrenia: Secondary | ICD-10-CM

## 2021-04-08 ENCOUNTER — Telehealth: Payer: Self-pay | Admitting: Family Medicine

## 2021-04-08 NOTE — Telephone Encounter (Signed)
Called the patient and spoke with pt's mother. Pt overall has been doing well. Advised that Amy reviewed compliance data and notes that there are still some mask leaks present. The mother states that she has started to tighten the mask a little more and that helped the numbers. She thinks maybe it just needs to be tightened more. She will start with that and call us back in couple weeks for [place] to check the data. Advised that if leaks are still present then she should looks at having her fitted at the company to ensure there are no leaks. She verbalized understanding of this plan and was appreciative or the call.

## 2021-04-08 NOTE — Telephone Encounter (Signed)
Please let her know that her compliance report looks pretty good! The leak is much better. It is still a little high at 37l/min but previously 80l/min. Please make sure she was able to get with DME for a mask refitting. I would encourage her to continue using BiPAP nightly for at least 4 hours. We will see her in follow up as planned.

## 2021-04-22 NOTE — Telephone Encounter (Signed)
Pt's mom called in for [place] to review the Download from her CPAP machine to make sure the mask leak has gotten better. Advised in reviewing the last 10 days there was only one night there was a leak. Advised the mom that as long as pt is tolerating the cpap well, she should keep doing what she is doing. She was very pleased to hear this.

## 2021-06-04 ENCOUNTER — Telehealth (HOSPITAL_BASED_OUTPATIENT_CLINIC_OR_DEPARTMENT_OTHER): Payer: Medicaid Other | Admitting: Psychiatry

## 2021-06-04 DIAGNOSIS — F419 Anxiety disorder, unspecified: Secondary | ICD-10-CM

## 2021-06-04 DIAGNOSIS — F200 Paranoid schizophrenia: Secondary | ICD-10-CM

## 2021-06-04 MED ORDER — ARIPIPRAZOLE 10 MG PO TABS: 10.0000 mg | ORAL_TABLET | Freq: Every day | ORAL | 0 refills | Status: DC

## 2021-06-04 MED ORDER — TEMAZEPAM 15 MG PO CAPS: 15.0000 mg | ORAL_CAPSULE | Freq: Every day | ORAL | 0 refills | Status: DC

## 2021-06-04 MED ORDER — SERTRALINE HCL 100 MG PO TABS: 150.0000 mg | ORAL_TABLET | Freq: Every day | ORAL | 0 refills | Status: DC

## 2021-06-04 NOTE — Progress Notes (Signed)
Virtual Visit via Telephone Note ? ?I connected with Erika [name] on 06/04/21 at  3:00 PM EDT by telephone and verified that I am speaking with the correct person using two identifiers. ? ?Location: ?Patient: Home ?Provider: Home Office ?  ?I discussed the limitations, risks, security and privacy concerns of performing an evaluation and management service by telephone and the availability of in person appointments. I also discussed with the patient that there may be a patient responsible charge related to this service. The patient expressed understanding and agreed to proceed. ? ? ?History of Present Illness: ?Patient is evaluated by phone session.  She is stable on her current medication.  She continues to work 20 hours a week and she really enjoyed her work.  Her mother also endorses that she has been doing well.  She continues to lose weight and recently had a visit with her PCP who is happy with the progress.  She denies any paranoia, hallucination, crying spells or any mania.  She is sleeping good with the help of CPAP.  She denies any major panic attacks or any suicidal thoughts.  She like to keep the current medication. ?   ?Past Psychiatric History:  ?H/O psychosis since teens. H/O at least 3 inpatient at [facility] due to paranoia and noncompliant with medication. H/O taking multiple meds which gradually reduced and discontinued. No h/o suicidal attempt.    ? ?Psychiatric Specialty Exam: ?Physical Exam  ?Review of Systems  ?There were no vitals taken for this visit.There is no height or weight on file to calculate BMI.  ?General Appearance: NA  ?Eye Contact:  NA  ?Speech:  Normal Rate  ?Volume:  Normal  ?Mood:  Euthymic  ?Affect:  NA  ?Thought Process:  Goal Directed  ?Orientation:  Full (Time, Place, and Person)  ?Thought Content:  WDL  ?Suicidal Thoughts:  No  ?Homicidal Thoughts:  No  ?Memory:  Immediate;   Good ?Recent;   Fair ?Remote;   Fair  ?Judgement:  Intact  ?Insight:  Present  ?Psychomotor  Activity:  NA  ?Concentration:  Concentration: Fair and Attention Span: Fair  ?Recall:  Good  ?Fund of Knowledge:  Fair  ?Language:  Good  ?Akathisia:  No  ?Handed:  Right  ?AIMS (if indicated):     ?Assets:  Communication Skills ?Desire for Improvement ?Housing ?Social Support  ?ADL's:  Intact  ?Cognition:  WNL  ?Sleep:   ok  ? ? ? ? ?Assessment and Plan: ?Schizophrenia chronic paranoid type.  Anxiety ? ?Patient is stable on her current medication.  Continue temazepam 15 mg at bedtime, Abilify 10 mg daily and Zoloft 150 mg daily.  Patient mother will go to her PCP to have a blood work result going to [place].  I recommended to call us back if she is any question, concern if she feels worsening of the symptoms.  Follow up in 3 months. ? ?Follow Up Instructions: ? ?  ?I discussed the assessment and treatment plan with the patient. The patient was provided an opportunity to ask questions and all were answered. The patient agreed with the plan and demonstrated an understanding of the instructions. ?  ?The patient was advised to call back or seek an in-person evaluation if the symptoms worsen or if the condition fails to improve as anticipated. ? ?Collaboration of Care: Other provider involved in patient's care AEB notes are in epic to review. ? ?Patient/Guardian was advised Release of Information must be obtained prior to any record release in  order to collaborate their care with an outside provider. Patient/Guardian was advised if they have not already done so to contact the registration department to sign all necessary forms in order for [place] to release information regarding their care.  ? ?Consent: Patient/Guardian gives verbal consent for treatment and assignment of benefits for services provided during this visit. Patient/Guardian expressed understanding and agreed to proceed.   ? ?I provided 20 minutes of non-face-to-face time during this encounter. ? ? ?Cleotis Nipper, MD  ?

## 2021-06-10 ENCOUNTER — Other Ambulatory Visit (HOSPITAL_COMMUNITY): Payer: Self-pay | Admitting: Psychiatry

## 2021-06-10 DIAGNOSIS — F200 Paranoid schizophrenia: Secondary | ICD-10-CM

## 2021-09-07 ENCOUNTER — Telehealth (HOSPITAL_COMMUNITY): Payer: Medicaid Other | Admitting: Psychiatry

## 2021-09-08 ENCOUNTER — Encounter (HOSPITAL_COMMUNITY): Payer: Self-pay | Admitting: Psychiatry

## 2021-09-08 ENCOUNTER — Telehealth (HOSPITAL_BASED_OUTPATIENT_CLINIC_OR_DEPARTMENT_OTHER): Payer: Medicaid Other | Admitting: Psychiatry

## 2021-09-08 DIAGNOSIS — F419 Anxiety disorder, unspecified: Secondary | ICD-10-CM

## 2021-09-08 DIAGNOSIS — F200 Paranoid schizophrenia: Secondary | ICD-10-CM | POA: Diagnosis not present

## 2021-09-08 MED ORDER — ARIPIPRAZOLE 10 MG PO TABS: 10.0000 mg | ORAL_TABLET | Freq: Every day | ORAL | 1 refills | Status: DC

## 2021-09-08 MED ORDER — SERTRALINE HCL 100 MG PO TABS: 150.0000 mg | ORAL_TABLET | Freq: Every day | ORAL | 1 refills | Status: DC

## 2021-09-08 MED ORDER — TEMAZEPAM 15 MG PO CAPS: 15.0000 mg | ORAL_CAPSULE | Freq: Every day | ORAL | 1 refills | Status: DC

## 2021-09-08 NOTE — Progress Notes (Signed)
Virtual Visit via Telephone Note  I connected with Erika [name] on 09/08/21 at  3:00 PM EDT by telephone and verified that I am speaking with the correct person using two identifiers.  Location: Patient: Home Provider: Home Office   I discussed the limitations, risks, security and privacy concerns of performing an evaluation and management service by telephone and the availability of in person appointments. I also discussed with the patient that there may be a patient responsible charge related to this service. The patient expressed understanding and agreed to proceed.   History of Present Illness: Patient is evaluated by phone session.  She is taking all her medication as prescribed and doing very well.  She continues to work 9 AM to 3 PM at [employer] and she did enjoy working there.  Recently it is taken over by [facility] and they require records from psychiatrist.  Patient mother is very involved in the treatment plan.  She reported patient sleeping good and does not have any more agitation, anger, crying spells or any hallucination.  She continued to lose weight and recently she had a visit with her PCP Dr. Ludwig Clarks who is very happy with the blood work.  However we have not received blood work results.  She has no tremors, shakes or any EPS.  She uses CPAP.  She denies drinking or using any illegal substances.  Past Psychiatric History:  H/O psychosis since teens. H/O at least 3 inpatient at [facility] due to paranoia and noncompliant with medication. H/O taking multiple meds which gradually reduced and discontinued. No h/o suicidal attempt.      Psychiatric Specialty Exam: Physical Exam  Review of Systems  Weight 240 lb (108.9 kg).There is no height or weight on file to calculate BMI.  General Appearance: NA  Eye Contact:  NA  Speech:  Slow  Volume:  Decreased  Mood:  Euthymic  Affect:  NA  Thought Process:  Goal Directed  Orientation:  Full (Time, Place, and Person)  Thought Content:   Logical  Suicidal Thoughts:  No  Homicidal Thoughts:  No  Memory:  Immediate;   Good Recent;   Fair Remote;   Fair  Judgement:  Intact  Insight:  Present  Psychomotor Activity:  NA  Concentration:  Concentration: Fair and Attention Span: Fair  Recall:  Good  Fund of Knowledge:  Good  Language:  Good  Akathisia:  No  Handed:  Right  AIMS (if indicated):     Assets:  Communication Skills Desire for Improvement Housing Social Support  ADL's:  Intact  Cognition:  WNL  Sleep:   ok      Assessment and Plan: Schizophrenia chronic paranoid type.  Anxiety.  Patient is doing very well on her current medication.  Continue temazepam 15 mg at bedtime, Abilify 10 mg daily and Zoloft 150 mg daily.  Patient will request her PCP to have her blood work results faxed to [place].  She will also come in to sign a release so we can send the records to [employer] and [facility].  Recommended to call us back if she has any question or any concern.  Patient doing better and I encourage to have a follow-up in 6 months however she can call us if there is any question, concern or worsening of symptoms.  Follow-up in 6 months  Follow Up Instructions:    I discussed the assessment and treatment plan with the patient. The patient was provided an opportunity to ask questions and all were  answered. The patient agreed with the plan and demonstrated an understanding of the instructions.   The patient was advised to call back or seek an in-person evaluation if the symptoms worsen or if the condition fails to improve as anticipated.  Collaboration of Care: Primary Care Provider AEB notes are available in epic.  Patient will call the PCP to have her blood work results faxed to [place].  Patient/Guardian was advised Release of Information must be obtained prior to any record release in order to collaborate their care with an outside provider. Patient/Guardian was advised if they have not already done so to contact the  registration department to sign all necessary forms in order for [place] to release information regarding their care.   Consent: Patient/Guardian gives verbal consent for treatment and assignment of benefits for services provided during this visit. Patient/Guardian expressed understanding and agreed to proceed.    I provided 22 minutes of non-face-to-face time during this encounter.   Cleotis Nipper, MD

## 2021-11-04 ENCOUNTER — Other Ambulatory Visit: Payer: Self-pay | Admitting: Internal Medicine

## 2021-11-04 DIAGNOSIS — Z1231 Encounter for screening mammogram for malignant neoplasm of breast: Secondary | ICD-10-CM

## 2021-12-14 ENCOUNTER — Ambulatory Visit
Admission: RE | Admit: 2021-12-14 | Discharge: 2021-12-14 | Disposition: A | Discharge status: Home / Self Care | Payer: Medicaid Other | Source: Ambulatory Visit | Attending: Internal Medicine | Admitting: Internal Medicine

## 2021-12-14 DIAGNOSIS — Z1231 Encounter for screening mammogram for malignant neoplasm of breast: Secondary | ICD-10-CM

## 2022-01-13 NOTE — Progress Notes (Signed)
PATIENT: Erika [name] DOB: Dec 28, 1975  REASON FOR VISIT: follow up HISTORY FROM: patient  Chief Complaint  Patient presents with   Follow-up    Pt in room #1 Pt here today for f/u with her OSA on BiPAP.    HISTORY OF PRESENT ILLNESS:  01/18/22 ALL:  Erika [name] returns for follow up for OSA on BiPAP. She continues to do well. She is using BiPAP every night for about 7-8 hours. She sleeps well. She denies concerns with machine or supplies. She has changed her mask but continues to have a leak. She does not seem bothered by leak and AHI is well managed.     01/13/2021 ALL: Erika [name] returns for follow up for OSA on BiPAP. She continues to do very well on therapy. She is using machine every night. She reports sleeping very well. She has not noted any concerns of leak. She feels mask is comfortable and fits well. She denies concerns. She received a great report with last eye exam.     01/08/2020 ALL:  Erika [name] is a 46 y.o. female here today for follow up for OSA on BiPAP.  She is doing very well on BiPAP therapy. She presents today with her mother who states that she is sleeping very well. They are excited that she is doing so well. She wakes feeling refreshed. She is able to work without becoming sleepy.   Compliance report dated 12/09/2019 through 01/07/2020 reveals that she used BiPAP 29 of the past 30 days for compliance of 97%.  She used BiPAP greater than 4 hours all 29 days.  Average usage was 7 hours and 13 minutes.  Residual AHI was 4.1 with IPAP of 18 and EPAP of 14 cm water pressure.  Respiratory rate of 10.  There was a large leak noted in the 95th percentile of 76.6 L/min.  HISTORY: (copied from Dr Dohmeier's note on 11/09/2018)  11-09-2018 Erika [name] is a 46 y.o. female with BiPAP treatment and MRDD , seen here as in a referral/ revisit  from Dr. Verl Dicker Partner , Dr. Rosemary Holms.  Patient was last seen 1 year ago, by Darrol Angel, NP. In the meantime she had a  bite guard made, which helped the patient with bruxism prevention but also seems to have had a positive effect on her breathing in conjunction with BiPAP 1 of many  risk factors has been her weight and her current BMI is 46.35.  However she has been a very compliant user of BiPAP for the treatment of obstructive sleep apnea and has no issues with the machine. Dr. Hulen Skains is her dentist, Erika [name]  has continued to be highly compliant she has used the machine 29 of the last 30 days including 08 November 2018.   Average user time is 6 hours and 27 minutes.  Her inspiratory pressure is 18 expiratory pressure 14 cmH2O and she uses a background SVT rate of 10 bpm so rate 10 breaths/min.  Residual AHI is 4.9 which is fine and she does have some major air leaks but the have been addressed with a new mask.   The AHI varies greatly from night to night.  The respiratory rate however states constant.  I do not need to make any adjustments in settings.    Interval history from 04 May 2017, I have the pleasure of meeting with Erika [name] and her mother today for the first time since her sleep studies were performed.  The patient underwent  a split-night polysomnography on 24 December 2016 and the study confirmed that he has a high degree of apnea with an AHI of 44.4/h of sleep.  This means that she has an apnea about every 80 seconds.  She spent all night in supine position so we could not determine if there was a positional component, and she did not reach REM sleep either.  She did not have significant low oxygen saturations none of prolonged duration however.  She was started on CPAP first was 5 cmH2O and was step-by-step titrated to 15 CPAP caused central apneas to emerge and did not reduce the overall apnea index.  The patient was therefore changed briefly to BiPAP but at that time the night was almost over.  Pressures of 15/10 centimeter and 17/12 centimeters were tried.  We asked the patient to return for a  full night BiPAP titration based on these limited results.  She also developed some periodic limb movements at night but were not present in her first study.  She returned for a full night titration on 30 November, BiPAP was now initiated at a low pressure and slowly advanced.  The first pressure was 8/5 cmH2O and she finally reached 17/13 cmH2O but her AHI was reduced to 3.8 apneas per hour of sleep from previously over 44.  Snoring also was alleviated at that higher pressure.  She was placed on a full facemask which I had initially not wanted but he tolerated a small size Simplus fullface mask very well.    Erika [name]'s mother is actually more thrilled than the patient about the results.  She no longer hears her daughter snoring or have apnea, the machine is very quiet and it has improved her own sleep significantly.  Erika [name] has been very compliant she has used the machine over 4 hours nightly for 26 out of the last 30 days, and she has used it on every of the last 30 days.  Average use of time is 5 hours and 49 minutes, the machine is set at 18/14 cmH2O BiPAP with a background respiratory rate of 10 breaths/min, her residual AHI is 0.0.  There are no more apneas.  She does have some high air leaks but these do not affect her sleep quality or the apnea control.  If troponin once I would change her to a nasal pillow or nasal mask.   Consult : She is according to her mother's observation snoring very loudly, for many years. She gained weight over the last 5 years, partially attributed to depression. The patient has a long psychiatric history, has a learning disability( Dr. Lolly MustacheArfeen)  She Gained weight over the last years due to psychotropic medication for the treatment of depression, she also has developed hypertension, and she was witnessed to have apnea after a medical procedure, but she cannot remember what kind of procedures this was. Sleep habits are as follows: Erika [name] usually watches TV for the  last hours of the day before she goes to bed. Her bedtime is around 10 PM, and she retreats to her bedroom at this time. Her bedroom is cool quiet and dark. She sleeps on her side. She avoids the right side because of an ophthalmological condition. She wears a mask for eye protection to sleep. She wakes up frequently to go to the bathroom at night at least 2 times. She rises in the morning and 5:15. Usually she has got 6 hours of sleep by that time. She feels usually rested and restored  in the morning but also reports a dry mouth. She likes to chew gum to give her relief from the dry mouth. She also drinks a lot of water during the day, but has to be reminded.    Sleep medical history and family sleep history: There is no other known family history of sleep apnea, but Ms. Gierke has been observed reading irregular in her sleep. She has diabetes, HTN, super obesity. Abnormal EKG- "flutter " .  She is on a sleep walker, has no history of night terrors or enuresis. She never underwent tonsillectomy and has no history of neck surgery or trauma.   Social history: Ms. Denunzio lives with her parents, she is usually the first one in bed and rises without resistance and the morning. She goes to an adult enrichment center, and enjoys it. She does not use tobacco in any form, she does not drink alcohol, caffeine use she does drink coffee, soda and ice tea. She does not drink more than 1 or 2 cups of caffeinated beverage a day. Mostly soda.   REVIEW OF SYSTEMS: Out of a complete 14 system review of symptoms, the patient complains only of the following symptoms, none and all other reviewed systems are negative.  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     ARIPiprazole (ABILIFY) 10 MG tablet Take 1 tablet (10 mg total) by mouth daily. 90 tablet 1   atorvastatin (LIPITOR) 20 MG tablet Take 20 mg by mouth at bedtime.     benazepril (LOTENSIN) 10 MG tablet Take 10 mg by mouth daily.     cholecalciferol (VITAMIN D) 1000 units tablet Take 1,000 Units by mouth daily.     famotidine (PEPCID) 20 MG tablet Take 20 mg by mouth 3 (three) times daily.     FARXIGA 10 MG TABS tablet Take 10 mg by mouth every morning.     ketoconazole (NIZORAL) 2 % cream Apply 1 application topically 2 (two) times daily.     loratadine (CLARITIN) 10 MG tablet Take 10 mg by mouth daily.     metFORMIN (GLUCOPHAGE) 1000 MG tablet 1,000 mg in the morning and at bedtime.     PAPAYA ENZYME PO Take 1 capsule by mouth 3 (three) times daily after meals.     sertraline (ZOLOFT) 100 MG tablet Take 1.5 tablets (150 mg total) by mouth daily. 135 tablet 1   temazepam (RESTORIL) 15 MG capsule Take 1 capsule (15 mg total) by mouth at bedtime. 90 capsule 1   No facility-administered medications prior to visit.    PAST MEDICAL HISTORY: Past Medical History:  Diagnosis Date   Anxiety    Depression    Diabetes (HCC)    GERD (gastroesophageal reflux disease)    GERD (gastroesophageal reflux disease)    Hyperlipemia    Hyperlipidemia    Learning disability    Mother helps her with medical needs   Obesity    OSA  treated with BiPAP     PAST SURGICAL HISTORY: Past Surgical History:  Procedure Laterality Date   ABDOMINAL HYSTERECTOMY     CHOLECYSTECTOMY     COLONOSCOPY WITH PROPOFOL N/A 03/19/2020   Procedure: COLONOSCOPY WITH PROPOFOL;  Surgeon: Willis Modena, MD;  Location: [facility];  Service: Endoscopy;  Laterality: N/A;    ESOPHAGOGASTRODUODENOSCOPY (EGD) WITH PROPOFOL N/A 07/07/2016   Procedure: ESOPHAGOGASTRODUODENOSCOPY (EGD) WITH PROPOFOL;  Surgeon: Carman Ching, MD;  Location: [facility];  Service: Endoscopy;  Laterality: N/A;    FAMILY HISTORY: Family History  Problem Relation Age of Onset   Hypertension Mother    Hyperlipidemia Mother    Diabetes Mother    Breast cancer Mother 31   Diabetes Father    Hypertension Father     SOCIAL HISTORY: Social History   Socioeconomic History   Marital status: Single    Spouse name: Not on file   Number of children: Not on file   Years of education: Not on file   Highest education level: Not on file  Occupational History   Not on file  Tobacco Use   Smoking status: Never   Smokeless tobacco: Never  Vaping Use   Vaping Use: Never used  Substance and Sexual Activity   Alcohol use: No    Alcohol/week: 0.0 standard drinks of alcohol   Drug use: No   Sexual activity: Not Currently    Birth control/protection: Surgical  Other Topics Concern   Not on file  Social History Narrative   Lives with her mom   Social Determinants of Health   Financial Resource Strain: Not on file  Food Insecurity: Not on file  Transportation Needs: Not on file  Physical Activity: Not on file  Stress: Not on file  Social Connections: Not on file  Intimate Partner Violence: Not on file     PHYSICAL EXAM  Vitals:   01/18/22 0740 01/18/22 0756  BP: (!) 168/105 (!) 142/80  Pulse: 80   Weight: 250 lb (113.4 kg)   Height: 5\' 4"  (1.626 m)      Body mass index is 42.91 kg/m.  Generalized: Well developed, in no acute distress  Cardiology: normal rate and rhythm, no murmur noted Respiratory: clear to auscultation bilaterally  Neurological examination  Mentation: Alert oriented to time, place, history taking. Follows all commands speech and language fluent Cranial nerve II-XII: Pupils were equal round reactive to light. Extraocular movements were full, visual  field were full  Motor: The motor testing reveals 5 over 5 strength of all 4 extremities. Good symmetric motor tone is noted throughout.  Gait and station: Gait is normal.    DIAGNOSTIC DATA (LABS, IMAGING, TESTING) - I reviewed patient records, labs, notes, testing and imaging myself where available.      No data to display           No results found for: "WBC", "HGB", "HCT", "MCV", "PLT" No results found for: "NA", "K", "CL", "CO2", "GLUCOSE", "BUN", "CREATININE", "CALCIUM", "PROT", "ALBUMIN", "AST", "ALT", "ALKPHOS", "BILITOT", "GFRNONAA", "GFRAA" No results found for: "CHOL", "HDL", "LDLCALC", "LDLDIRECT", "TRIG", "CHOLHDL" No results found for: "HGBA1C" No results found for: "VITAMINB12" No results found for: "TSH"   ASSESSMENT AND PLAN 46 y.o. year old female  has a past medical history of Anxiety, Depression, Diabetes (HCC), GERD (gastroesophageal reflux disease), GERD (gastroesophageal reflux disease), Hyperlipemia, Hyperlipidemia, Learning disability, Obesity, and OSA treated with BiPAP. here with     ICD-10-CM   1. OSA treated with BiPAP  G47.33 For  home use only DME Bipap       WENONAH MILO is doing well on BiPAP therapy. She does continue to have a large leak, however, AHI is ar goal. I have educated her and her mother of ways to monitor for and correct leak at home. She was encouraged to continue using BiPAP nightly and for greater than 4 hours each night. We will update supply orders as indicated. Risks of untreated sleep apnea review and education materials provided. Healthy lifestyle habits encouraged. She will follow up in 1 year, sooner if needed. She verbalizes understanding and agreement with this plan.    Orders Placed This Encounter  Procedures   For home use only DME Bipap    Supplies    Order Specific Question:   Length of Need    Answer:   [facility]    Order Specific Question:   Inspiratory pressure    Answer:   OTHER SEE COMMENTS    Order  Specific Question:   Expiratory pressure    Answer:   OTHER SEE COMMENTS      No orders of the defined types were placed in this encounter.     Shawnie Dapper, FNP-C 01/18/2022, 8:03 AM [facility] [address], [address] [place], [place] [address] [phone]

## 2022-01-13 NOTE — Patient Instructions (Signed)
Please continue using your BiPAP regularly. While your insurance requires that you use BiPAP at least 4 hours each night on 70% of the nights, I recommend, that you not skip any nights and use it throughout the night if you can. Getting used to BiPAP and staying with the treatment long term does take time and patience and discipline. Untreated obstructive sleep apnea when it is moderate to severe can have an adverse impact on cardiovascular health and raise her risk for heart disease, arrhythmias, hypertension, congestive heart failure, stroke and diabetes. Untreated obstructive sleep apnea causes sleep disruption, nonrestorative sleep, and sleep deprivation. This can have an impact on your day to day functioning and cause daytime sleepiness and impairment of cognitive function, memory loss, mood disturbance, and problems focussing. Using BiPAP regularly can improve these symptoms.   Follow up in 1 year  

## 2022-01-17 IMAGING — MG MM DIGITAL SCREENING BILAT W/ TOMO AND CAD
6 of 12 series · 6 of 36 positions shown · non-contrast
Comparison: Previous exam(s).

ACR Breast Density Category a: The breast tissue is almost entirely
fatty.

CLINICAL DATA: Screening.

EXAM:
DIGITAL SCREENING BILATERAL MAMMOGRAM WITH TOMOSYNTHESIS AND CAD
TECHNIQUE: Bilateral screening digital craniocaudal and mediolateral oblique
mammograms were obtained. Bilateral screening digital breast
tomosynthesis was performed. The images were evaluated with
computer-aided detection.

[L CC synth-2D (1 of 2)]
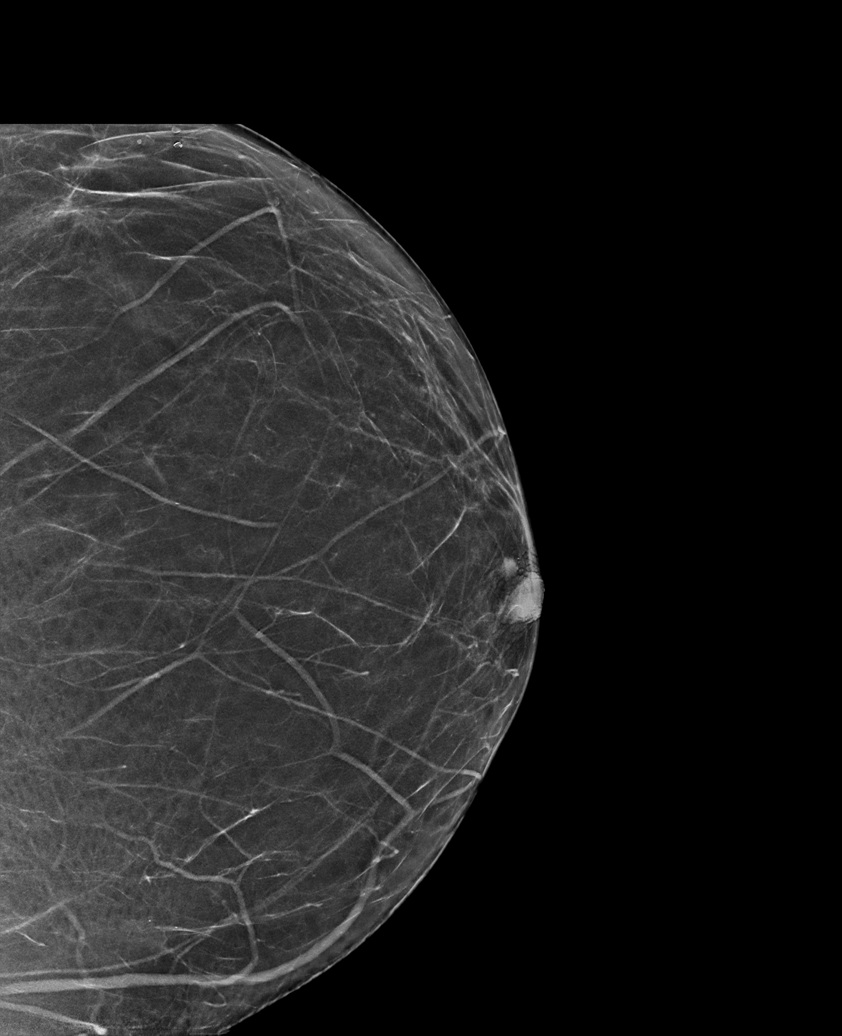

[R CV synth-2D]
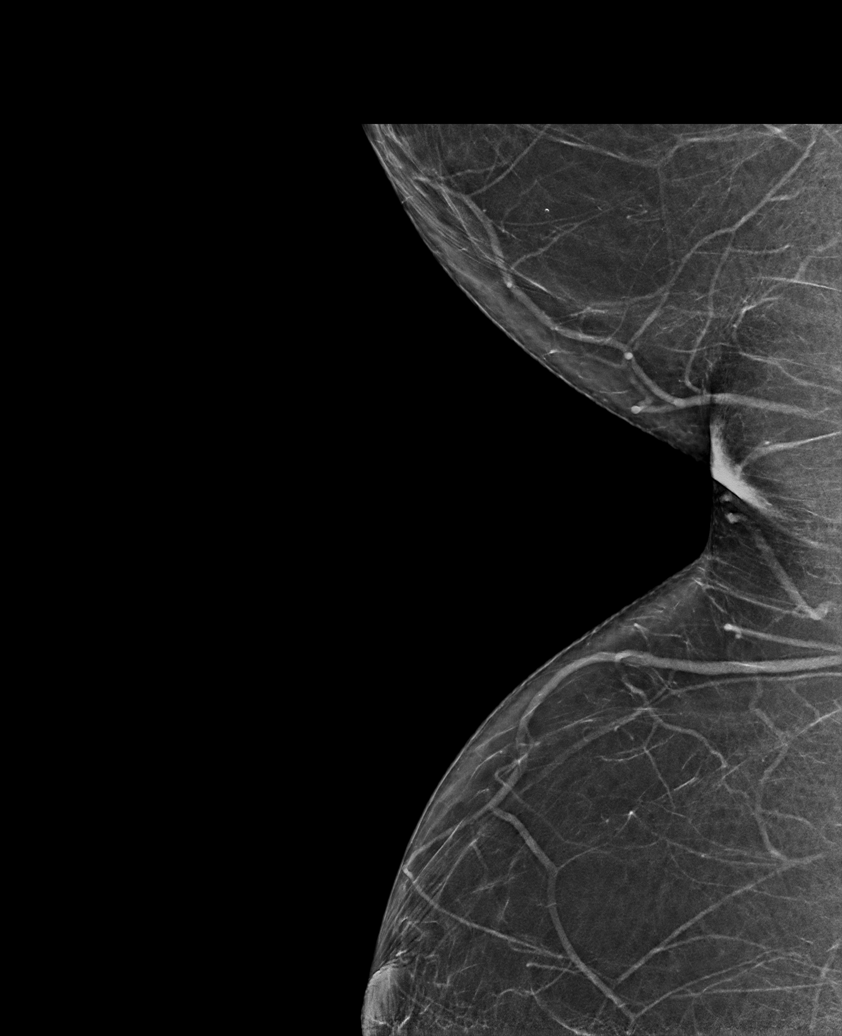

[L MLO synth-2D]
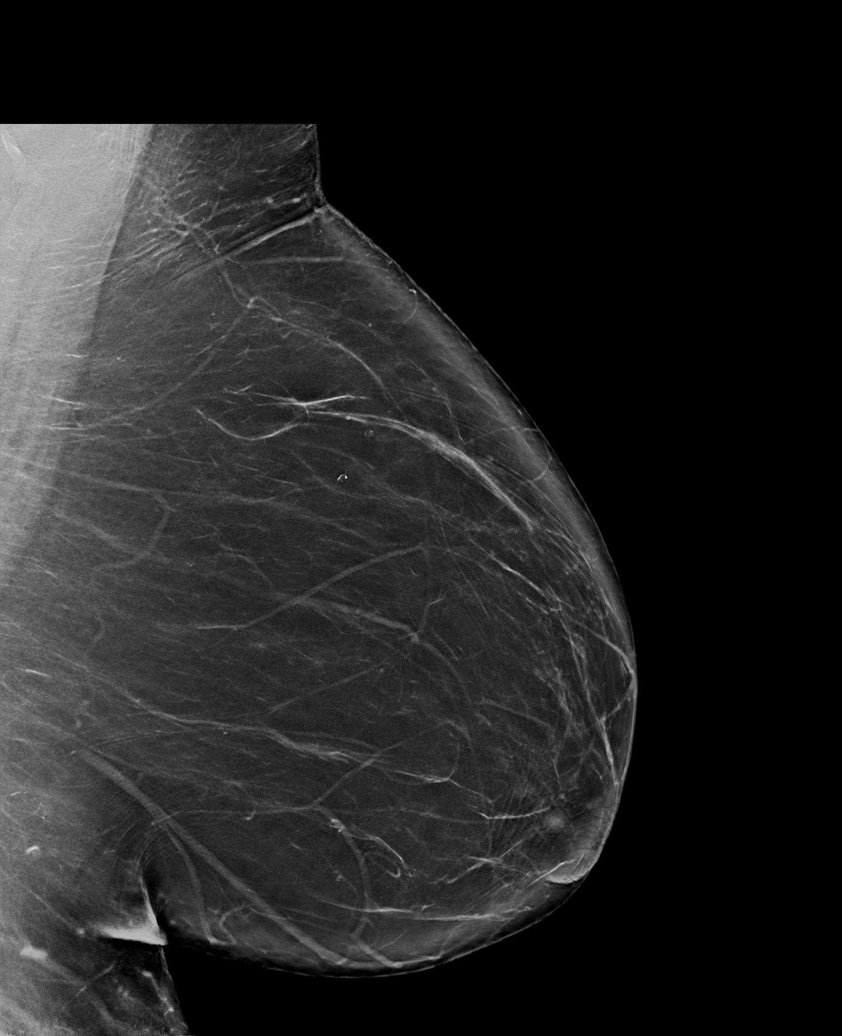

[L CC synth-2D (2 of 2)]
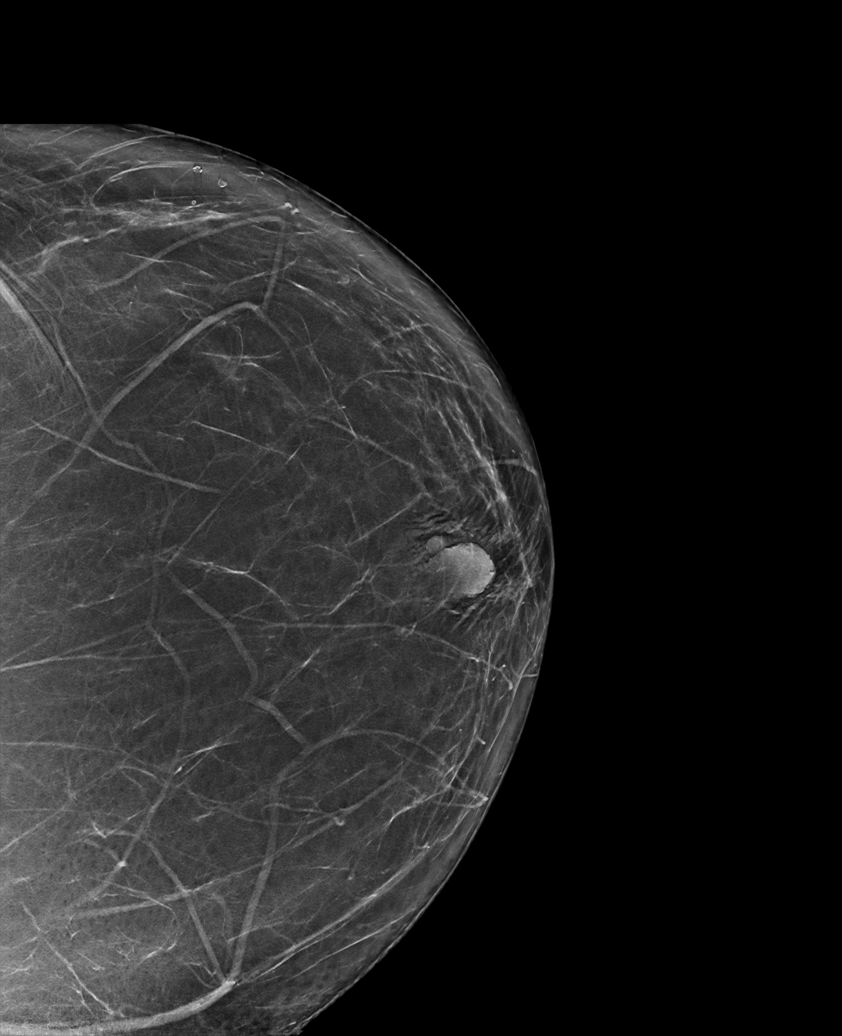

[R CC synth-2D]
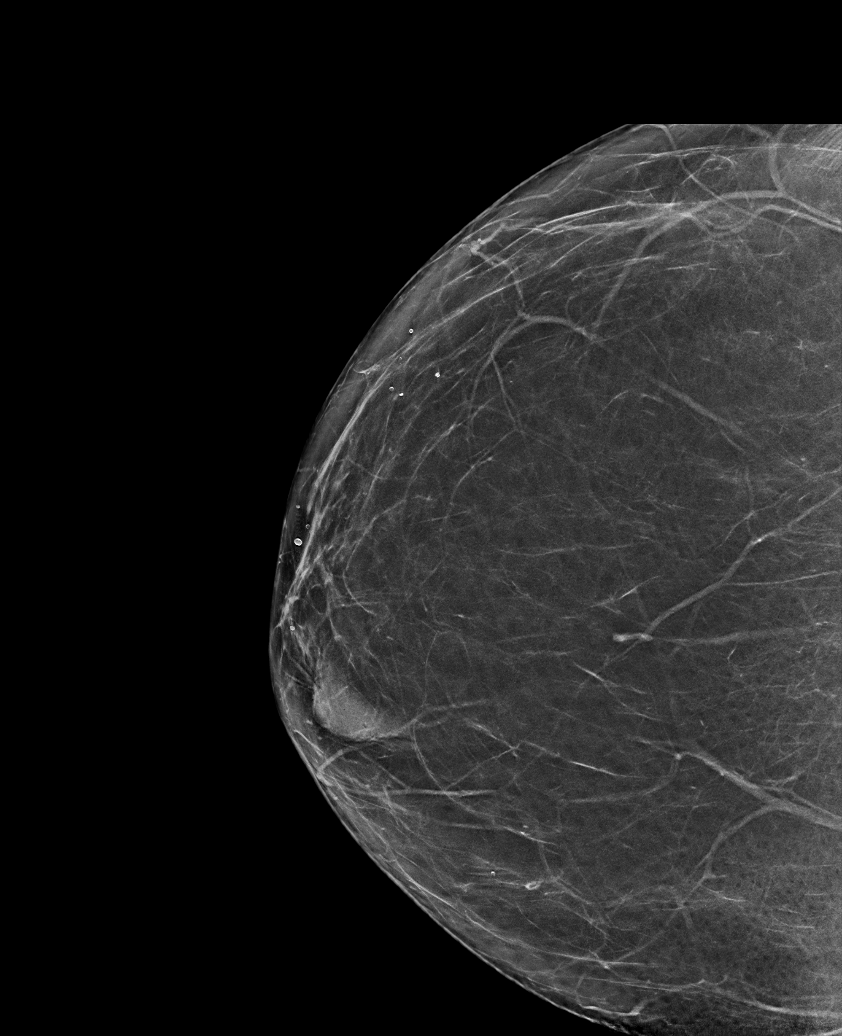

[R MLO synth-2D]
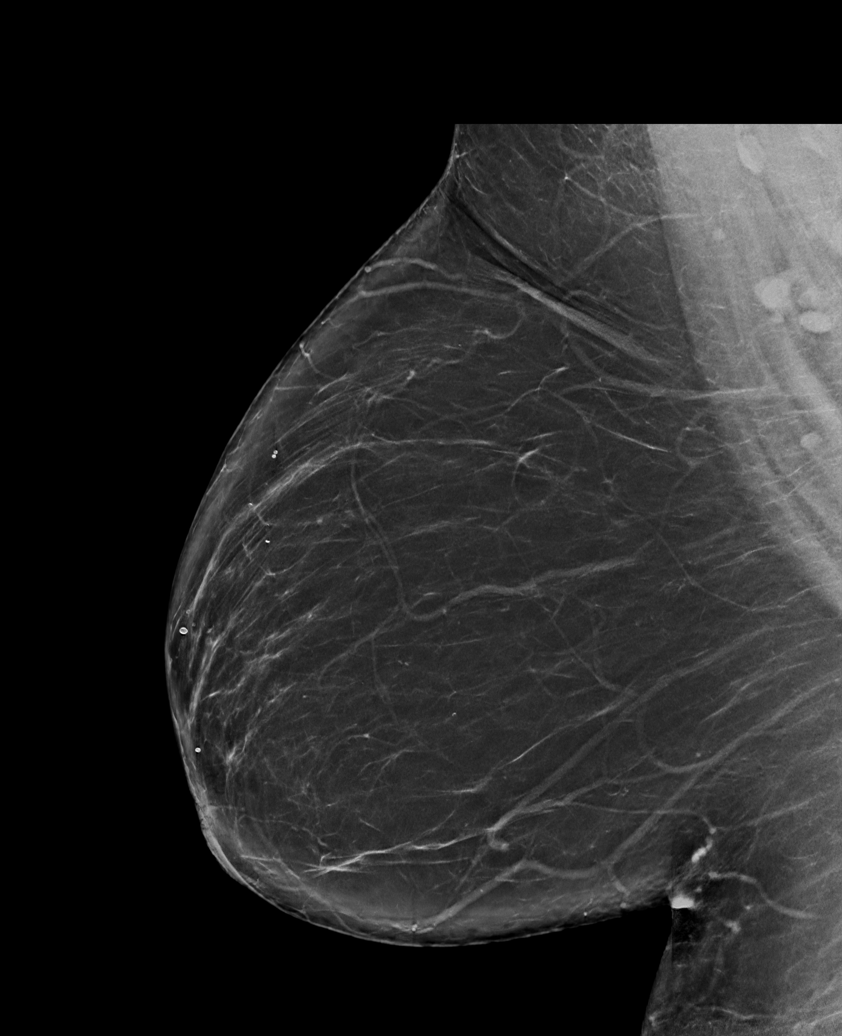

[6 of 36 positions shown; findings below may reference images not displayed]

FINDINGS: There are no findings suspicious for malignancy.
IMPRESSION: No mammographic evidence of malignancy. A result letter of this
screening mammogram will be mailed directly to the patient.

RECOMMENDATION:
Screening mammogram in one year. (Code:0E-3-N98)

BI-RADS CATEGORY  1: Negative.

## 2022-01-18 ENCOUNTER — Ambulatory Visit: Payer: Medicaid Other | Admitting: Family Medicine

## 2022-01-18 ENCOUNTER — Encounter: Payer: Self-pay | Admitting: Family Medicine

## 2022-01-18 VITALS — BP 142/80 | HR 80 | Ht 64.0 in | Wt 250.0 lb

## 2022-01-18 DIAGNOSIS — G4733 Obstructive sleep apnea (adult) (pediatric): Secondary | ICD-10-CM | POA: Diagnosis not present

## 2022-01-19 ENCOUNTER — Telehealth: Payer: Self-pay

## 2022-03-10 ENCOUNTER — Other Ambulatory Visit (HOSPITAL_COMMUNITY): Payer: Self-pay | Admitting: Psychiatry

## 2022-03-10 ENCOUNTER — Telehealth (HOSPITAL_COMMUNITY): Payer: Medicaid Other | Admitting: Psychiatry

## 2022-03-10 DIAGNOSIS — F200 Paranoid schizophrenia: Secondary | ICD-10-CM

## 2022-03-10 DIAGNOSIS — F419 Anxiety disorder, unspecified: Secondary | ICD-10-CM

## 2022-03-17 ENCOUNTER — Telehealth (HOSPITAL_BASED_OUTPATIENT_CLINIC_OR_DEPARTMENT_OTHER): Payer: Medicaid Other | Admitting: Psychiatry

## 2022-03-17 ENCOUNTER — Encounter (HOSPITAL_COMMUNITY): Payer: Self-pay | Admitting: Psychiatry

## 2022-03-17 DIAGNOSIS — F200 Paranoid schizophrenia: Secondary | ICD-10-CM

## 2022-03-17 DIAGNOSIS — F419 Anxiety disorder, unspecified: Secondary | ICD-10-CM

## 2022-03-17 MED ORDER — TEMAZEPAM 15 MG PO CAPS: 15.0000 mg | ORAL_CAPSULE | Freq: Every day | ORAL | 1 refills | Status: DC

## 2022-03-17 MED ORDER — SERTRALINE HCL 100 MG PO TABS: 150.0000 mg | ORAL_TABLET | Freq: Every day | ORAL | 1 refills | Status: DC

## 2022-03-17 MED ORDER — ARIPIPRAZOLE 10 MG PO TABS: 5.0000 mg | ORAL_TABLET | Freq: Every day | ORAL | 1 refills | Status: DC

## 2022-03-17 NOTE — Progress Notes (Signed)
Virtual Visit via Telephone Note  I connected with Erika [name] on 03/17/22 at  3:40 PM EST by telephone and verified that I am speaking with the correct person using two identifiers.  Location: Patient: Home Provider: Home Office   I discussed the limitations, risks, security and privacy concerns of performing an evaluation and management service by telephone and the availability of in person appointments. I also discussed with the patient that there may be a patient responsible charge related to this service. The patient expressed understanding and agreed to proceed.   History of Present Illness: Patient is evaluated by phone session.  Her mother was also present in the session.  Patient told she had a very good Christmas with the family.  She denies any paranoia, hallucination and continues to work at [employer] 5 days a week.  She likes her job.  Her mother reported that she has been out of the medication for past 2 days and noticed some anxiety and nervousness and not sleeping all night.  She is trying to lose weight and recently had a visit with her PCP Dr. Mellody Drown.  Patient told her hemoglobin A1c was 6.5 and PCP is happy with the weight blood work.  Patient has no tremor or shakes or any EPS.  He denies any mania, psychosis, hallucination.  She denies any suicidal thoughts.  Her appetite is okay.  She denies any major panic attack.  Past Psychiatric History:  H/O psychosis since teens. H/O at least 3 inpatient at [facility] due to paranoia and noncompliant with medication. H/O taking multiple meds which gradually reduced and discontinued. No h/o suicidal attempt.      Psychiatric Specialty Exam: Physical Exam  Review of Systems  Weight 240 lb (108.9 kg).There is no height or weight on file to calculate BMI.  General Appearance: NA  Eye Contact:  NA  Speech:  Slow  Volume:  Normal  Mood:  Euthymic  Affect:  NA  Thought Process:  Goal Directed  Orientation:  Full (Time, Place, and  Person)  Thought Content:  WDL  Suicidal Thoughts:  No  Homicidal Thoughts:  No  Memory:  Immediate;   Good Recent;   Fair Remote;   Fair  Judgement:  Intact  Insight:  Present  Psychomotor Activity:  NA  Concentration:  Concentration: Fair and Attention Span: Fair  Recall:  Good  Fund of Knowledge:  Fair  Language:  Good  Akathisia:  No  Handed:  Right  AIMS (if indicated):     Assets:  Communication Skills Desire for Improvement Housing Resilience Social Support  ADL's:  Intact  Cognition:  WNL  Sleep:   fair      Assessment and Plan: Schizophrenia chronic paranoid type.  Anxiety.  Patient is stable on her current medication.  I recommend try to cut down the Abilify 5 mg as patient doing very well however mother is reluctant but willing to try to cut down the dose.  Continue temazepam 15 mg at bedtime, Zoloft 150 mg daily and she will try Abilify 5 mg however if started to have symptoms coming back then she will take the full dose of Abilify 10 mg.  Recommend to call us back if she has any question or any concern.  Follow-up in 3 months.  Follow Up Instructions:    I discussed the assessment and treatment plan with the patient. The patient was provided an opportunity to ask questions and all were answered. The patient agreed with the plan and  demonstrated an understanding of the instructions.   The patient was advised to call back or seek an in-person evaluation if the symptoms worsen or if the condition fails to improve as anticipated.  Collaboration of Care: Other provider involved in patient's care AEB notes are available in epic to review.  Patient/Guardian was advised Release of Information must be obtained prior to any record release in order to collaborate their care with an outside provider. Patient/Guardian was advised if they have not already done so to contact the registration department to sign all necessary forms in order for [place] to release information  regarding their care.   Consent: Patient/Guardian gives verbal consent for treatment and assignment of benefits for services provided during this visit. Patient/Guardian expressed understanding and agreed to proceed.    I provided 17th minutes of non-face-to-face time during this encounter.   Kathlee Nations, MD

## 2022-06-16 ENCOUNTER — Telehealth (HOSPITAL_COMMUNITY): Payer: Medicaid Other | Admitting: Psychiatry

## 2022-09-07 ENCOUNTER — Other Ambulatory Visit (HOSPITAL_COMMUNITY): Payer: Self-pay | Admitting: Psychiatry

## 2022-09-07 DIAGNOSIS — F419 Anxiety disorder, unspecified: Secondary | ICD-10-CM

## 2022-09-07 DIAGNOSIS — F200 Paranoid schizophrenia: Secondary | ICD-10-CM

## 2022-09-15 ENCOUNTER — Telehealth (HOSPITAL_BASED_OUTPATIENT_CLINIC_OR_DEPARTMENT_OTHER): Payer: MEDICAID | Admitting: Psychiatry

## 2022-09-15 ENCOUNTER — Encounter (HOSPITAL_COMMUNITY): Payer: Self-pay | Admitting: Psychiatry

## 2022-09-15 ENCOUNTER — Telehealth (HOSPITAL_COMMUNITY): Payer: MEDICAID | Admitting: Psychiatry

## 2022-09-15 VITALS — Wt 230.0 lb

## 2022-09-15 DIAGNOSIS — F200 Paranoid schizophrenia: Secondary | ICD-10-CM

## 2022-09-15 DIAGNOSIS — F419 Anxiety disorder, unspecified: Secondary | ICD-10-CM

## 2022-09-15 MED ORDER — ARIPIPRAZOLE 5 MG PO TABS: 5.0000 mg | ORAL_TABLET | Freq: Every day | ORAL | 1 refills | Status: DC

## 2022-09-15 MED ORDER — TEMAZEPAM 15 MG PO CAPS: 15.0000 mg | ORAL_CAPSULE | Freq: Every day | ORAL | 1 refills | Status: DC

## 2022-09-15 MED ORDER — SERTRALINE HCL 100 MG PO TABS: 150.0000 mg | ORAL_TABLET | Freq: Every day | ORAL | 1 refills | Status: DC

## 2022-09-15 NOTE — Progress Notes (Signed)
[facility] MD Virtual Progress Note   Patient Location: Home Provider Location: Home Office  I connect with patient by telephone and verified that I am speaking with correct person by using two identifiers. I discussed the limitations of evaluation and management by telemedicine and the availability of in person appointments. I also discussed with the patient that there may be a patient responsible charge related to this service. The patient expressed understanding and agreed to proceed.  Erika [name] [id number] 47 y.o.  09/15/2022 2:51 PM  History of Present Illness:  Patient is evaluated by phone session.  Her mother was present in the session.  On the last visit we cut down the Abilify to 5 mg.  She was given 10 mg but she is using half tablet and that seems to be working well.  Patient denies any paranoia, hallucination, irritability.  She reported more active and continues to work 5 days a week at [employer].  She denies any panic attack, crying spells.  She lost another 10 pounds in the past 6 months.  She has appointment coming up with her primary care in few weeks.  She has no tremors, shakes or any EPS.  She sleeps good with the help of temazepam.  Her mother reported she has been doing very well on current medication and does not want to change.  Past Psychiatric History: H/O psychosis since teens. H/O at least 3 inpatient at [facility] due to paranoia and noncompliant with medication. H/O taking multiple meds which gradually reduced and discontinued. No h/o suicidal attempt.        Outpatient Encounter Medications as of 09/15/2022  Medication Sig   Accu-Chek FastClix Lancets MISC USE TO CHECK GLUCOSE TWICE DAILY   ACCU-CHEK GUIDE test strip USE 1 TEST STRIP TO TEST BLOOD GLUCOSE ONCE DAILY AS DIRECTED   acetaminophen (TYLENOL) 500 MG tablet Take 1,000 mg by mouth every 6 (six) hours as needed for moderate pain or headache.   ARIPiprazole (ABILIFY) 10 MG tablet Take 0.5-1  tablets (5-10 mg total) by mouth daily.   atorvastatin (LIPITOR) 20 MG tablet Take 20 mg by mouth at bedtime.   benazepril (LOTENSIN) 10 MG tablet Take 10 mg by mouth daily.   cholecalciferol (VITAMIN D) 1000 units tablet Take 1,000 Units by mouth daily.   famotidine (PEPCID) 20 MG tablet Take 20 mg by mouth 3 (three) times daily.   FARXIGA 10 MG TABS tablet Take 10 mg by mouth every morning.   ketoconazole (NIZORAL) 2 % cream Apply 1 application topically 2 (two) times daily.   loratadine (CLARITIN) 10 MG tablet Take 10 mg by mouth daily.   metFORMIN (GLUCOPHAGE) 1000 MG tablet 1,000 mg in the morning and at bedtime.   PAPAYA ENZYME PO Take 1 capsule by mouth 3 (three) times daily after meals.   sertraline (ZOLOFT) 100 MG tablet Take 1.5 tablets (150 mg total) by mouth daily.   temazepam (RESTORIL) 15 MG capsule Take 1 capsule (15 mg total) by mouth at bedtime.   No facility-administered encounter medications on file as of 09/15/2022.    No results found for this or any previous visit (from the past 2160 hour(s)).   Psychiatric Specialty Exam: Physical Exam  Review of Systems  Weight 230 lb (104.3 kg).There is no height or weight on file to calculate BMI.  General Appearance: NA  Eye Contact:  NA  Speech:  Slow  Volume:  Normal  Mood:  Euthymic  Affect:  NA  Thought Process:  Goal Directed  Orientation:  Full (Time, Place, and Person)  Thought Content:  WDL  Suicidal Thoughts:  No  Homicidal Thoughts:  No  Memory:  Immediate;   Good Recent;   Fair Remote;   Fair  Judgement:  Intact  Insight:  Present  Psychomotor Activity:  NA  Concentration:  Concentration: Fair and Attention Span: Fair  Recall:  [employer] of Knowledge:  Fair  Language:  Fair  Akathisia:  No  Handed:  Right  AIMS (if indicated):     Assets:  Communication Skills Desire for Improvement Housing Social Support  ADL's:  Intact  Cognition:  WNL  Sleep:  fair     Assessment/Plan: Paranoid  schizophrenia (HCC) - Plan: ARIPiprazole (ABILIFY) 5 MG tablet, sertraline (ZOLOFT) 100 MG tablet, temazepam (RESTORIL) 15 MG capsule  Anxiety - Plan: sertraline (ZOLOFT) 100 MG tablet, temazepam (RESTORIL) 15 MG capsule  Patient doing very well on current medication.  I recommend to continue Abilify 5 mg daily since it has been working well, Zoloft 150 mg daily and temazepam 15 mg at bedtime.  Recommended to call us back with any question or any concern.  Follow-up in 3 months   Follow Up Instructions:     I discussed the assessment and treatment plan with the patient. The patient was provided an opportunity to ask questions and all were answered. The patient agreed with the plan and demonstrated an understanding of the instructions.   The patient was advised to call back or seek an in-person evaluation if the symptoms worsen or if the condition fails to improve as anticipated.    Collaboration of Care: Other provider involved in patient's care AEB notes are available in epic to review.  Patient/Guardian was advised Release of Information must be obtained prior to any record release in order to collaborate their care with an outside provider. Patient/Guardian was advised if they have not already done so to contact the registration department to sign all necessary forms in order for [place] to release information regarding their care.   Consent: Patient/Guardian gives verbal consent for treatment and assignment of benefits for services provided during this visit. Patient/Guardian expressed understanding and agreed to proceed.     I provided 20 minutes of non face to face time during this encounter.  Note: This document was prepared by [employer] voice dictation technology and any errors that results from this process are unintentional.    Cleotis Nipper, MD 09/15/2022

## 2022-11-09 ENCOUNTER — Other Ambulatory Visit: Payer: Self-pay | Admitting: Internal Medicine

## 2022-11-09 DIAGNOSIS — Z1231 Encounter for screening mammogram for malignant neoplasm of breast: Secondary | ICD-10-CM

## 2022-12-16 ENCOUNTER — Ambulatory Visit
Admission: RE | Admit: 2022-12-16 | Discharge: 2022-12-16 | Disposition: A | Discharge status: Home / Self Care | Payer: MEDICAID | Source: Ambulatory Visit | Attending: Internal Medicine | Admitting: Internal Medicine

## 2022-12-16 DIAGNOSIS — Z1231 Encounter for screening mammogram for malignant neoplasm of breast: Secondary | ICD-10-CM

## 2022-12-17 ENCOUNTER — Telehealth (HOSPITAL_COMMUNITY): Payer: Self-pay | Admitting: *Deleted

## 2022-12-17 NOTE — Telephone Encounter (Signed)
Pa for Abilify 5 mg every day #90 submitted to [facility] via the CoverMyMeds portal.   Awaiting determination.

## 2022-12-21 ENCOUNTER — Telehealth (HOSPITAL_COMMUNITY): Payer: Self-pay | Admitting: *Deleted

## 2022-12-21 NOTE — Telephone Encounter (Signed)
PA for Abilify 5 mg tablets has been Approved by [facility].   PA valid from 12/21/22 through 12/21/23.

## 2023-01-19 NOTE — Progress Notes (Deleted)
PATIENT: Erika [name] Erika [name] [name] DOB: Jan 10, 1976  REASON FOR VISIT: follow up HISTORY FROM: patient  No chief complaint on file.   HISTORY OF PRESENT ILLNESS:  01/19/23 ALL:  Erika [name] Erika [name] [name] returns for follow up for OSA on BiPAP.     01/18/2022 ALL: Erika [name] Erika [name] [name] returns for follow up for OSA on BiPAP. She continues to do well. She is using BiPAP every night for about 7-8 hours. She sleeps well. She denies concerns with machine or supplies. She has changed Erika mask but continues to have a leak. She does not seem bothered by leak and AHI is well managed.     01/13/2021 ALL: Erika [name] Erika [name] [name] returns for follow up for OSA on BiPAP. She continues to do very well on therapy. She is using machine every night. She reports sleeping very well. She has not noted any concerns of leak. She feels mask is comfortable and fits well. She denies concerns. She received a great report with last eye exam.     01/08/2020 ALL:  Erika [name] Erika [name] [name] is a 47 y.o. female here today for follow up for OSA on BiPAP.  She is doing very well on BiPAP therapy. She presents today with Erika [name] Erika [name] [name] who states that she is sleeping very well. They are excited that she is doing so well. She wakes feeling refreshed. She is able to work without becoming sleepy.   Compliance report dated 12/09/2019 through 01/07/2020 reveals that she used BiPAP 29 of the past 30 days for compliance of 97%.  She used BiPAP greater than 4 hours all 29 days.  Average usage was 7 hours and 13 minutes.  Residual AHI was 4.1 with IPAP of 18 and EPAP of 14 cm water pressure.  Respiratory rate of 10.  There was a large leak noted in the 95th percentile of 76.6 L/min.  HISTORY: (copied from Dr Dohmeier's note on 11/09/2018)  11-09-2018 Erika [name] Erika [name] [name] is a 47 y.o. female with BiPAP treatment and MRDD , seen here as in a referral/ revisit  from Dr. Verl Dicker Partner , Dr. Rosemary Holms.  Patient was last seen 1 year ago, by Darrol Angel, NP. In the meantime she had a bite guard made,  which helped the patient with bruxism prevention but also seems to have had a positive effect on Erika breathing in conjunction with BiPAP 1 of many  risk factors has been Erika weight and Erika current BMI is 46.35.  However she has been a very compliant user of BiPAP for the treatment of obstructive sleep apnea and has no issues with the machine. Dr. Hulen Skains is Erika dentist, Erika [name] Erika [name] [name]  has continued to be highly compliant she has used the machine 29 of the last 30 days including 08 November 2018.   Average user time is 6 hours and 27 minutes.  Erika inspiratory pressure is 18 expiratory pressure 14 cmH2O and she uses a background SVT rate of 10 bpm so rate 10 breaths/min.  Residual AHI is 4.9 which is fine and she does have some major air leaks but the have been addressed with a new mask.   The AHI varies greatly from night to night.  The respiratory rate however states constant.  I do not need to make any adjustments in settings.    Interval history from 04 May 2017, I have the pleasure of meeting with Erika [name] Erika [name] [name] and Erika [name] Erika [name] [name] today for the first time since Erika sleep studies were performed.  The patient underwent a split-night polysomnography on 24 December 2016  and the study confirmed that he has a high degree of apnea with an AHI of 44.4/h of sleep.  This means that she has an apnea about every 80 seconds.  She spent all night in supine position so we could not determine if there was a positional component, and she did not reach REM sleep either.  She did not have significant low oxygen saturations none of prolonged duration however.  She was started on CPAP first was 5 cmH2O and was step-by-step titrated to 15 CPAP caused central apneas to emerge and did not reduce the overall apnea index.  The patient was therefore changed briefly to BiPAP but at that time the night was almost over.  Pressures of 15/10 centimeter and 17/12 centimeters were tried.  We asked the patient to return for a full night BiPAP  titration based on these limited results.  She also developed some periodic limb movements at night but were not present in Erika first study.  She returned for a full night titration on 30 November, BiPAP was now initiated at a low pressure and slowly advanced.  The first pressure was 8/5 cmH2O and she finally reached 17/13 cmH2O but Erika AHI was reduced to 3.8 apneas per hour of sleep from previously over 44.  Snoring also was alleviated at that higher pressure.  She was placed on a full facemask which I had initially not wanted but he tolerated a small size Simplus fullface mask very well.    Erika [name] Erika [name] [name] is actually more thrilled than the patient about the results.  She no longer hears Erika daughter snoring or have apnea, the machine is very quiet and it has improved Erika own sleep significantly.  Erika [name] Erika [name] [name] has been very compliant she has used the machine over 4 hours nightly for 26 out of the last 30 days, and she has used it on every of the last 30 days.  Average use of time is 5 hours and 49 minutes, the machine is set at 18/14 cmH2O BiPAP with a background respiratory rate of 10 breaths/min, Erika residual AHI is 0.0.  There are no more apneas.  She does have some high air leaks but these do not affect Erika sleep quality or the apnea control.  If troponin once I would change Erika to a nasal pillow or nasal mask.   Consult : She is according to Erika [name] Erika [name] [name]'s observation snoring very loudly, for many years. She gained weight over the last 5 years, partially attributed to depression. The patient has a long psychiatric history, has a learning disability( Dr. Lolly Mustache)  She Gained weight over the last years due to psychotropic medication for the treatment of depression, she also has developed hypertension, and she was witnessed to have apnea after a medical procedure, but she cannot remember what kind of procedures this was. Sleep habits are as follows: Erika [name] Erika [name] [name] usually watches TV for the last hours of the  day before she goes to bed. Erika bedtime is around 10 PM, and she retreats to Erika bedroom at this time. Erika bedroom is cool quiet and dark. She sleeps on Erika side. She avoids the right side because of an ophthalmological condition. She wears a mask for eye protection to sleep. She wakes up frequently to go to the bathroom at night at least 2 times. She rises in the morning and 5:15. Usually she has got 6 hours of sleep by that time. She feels usually rested and restored in the morning but also reports a  dry mouth. She likes to chew gum to give Erika relief from the dry mouth. She also drinks a lot of water during the day, but has to be reminded.    Sleep medical history and family sleep history: There is no other known family history of sleep apnea, but Erika [name] Erika [name] [name] has been observed reading irregular in Erika sleep. She has diabetes, HTN, super obesity. Abnormal EKG- "flutter " .  She is on a sleep walker, has no history of night terrors or enuresis. She never underwent tonsillectomy and has no history of neck surgery or trauma.   Social history: Erika [name] Erika [name] [name] lives with Erika parents, she is usually the first one in bed and rises without resistance and the morning. She goes to an adult enrichment center, and enjoys it. She does not use tobacco in any form, she does not drink alcohol, caffeine use she does drink coffee, soda and ice tea. She does not drink more than 1 or 2 cups of caffeinated beverage a day. Mostly soda.   REVIEW OF SYSTEMS: Out of a complete 14 system review of symptoms, the patient complains only of the following symptoms, none and all other reviewed systems are negative.  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     ARIPiprazole (ABILIFY) 5 MG tablet Take 1 tablet (5 mg total) by mouth daily. 90 tablet 1   atorvastatin (LIPITOR) 20 MG tablet Take 20 mg by mouth at bedtime.     benazepril (LOTENSIN) 10 MG tablet Take 10 mg by mouth daily.     cholecalciferol (VITAMIN D) 1000 units tablet Take 1,000 Units by mouth daily.     famotidine (PEPCID) 20 MG tablet Take 20 mg by mouth 3 (three) times daily.     FARXIGA 10 MG TABS tablet Take 10 mg by mouth every morning.     ketoconazole (NIZORAL) 2 % cream Apply 1 application topically 2 (two) times daily.     loratadine (CLARITIN) 10 MG tablet Take 10 mg by mouth daily.     metFORMIN (GLUCOPHAGE) 1000 MG tablet 1,000 mg in the morning and at bedtime.     PAPAYA ENZYME PO Take 1 capsule by mouth 3 (three) times daily after meals.     sertraline (ZOLOFT) 100 MG tablet Take 1.5 tablets (150 mg total) by mouth daily. 135 tablet 1   temazepam (RESTORIL) 15 MG capsule Take 1 capsule (15 mg total) by mouth at bedtime. 90 capsule 1   No facility-administered medications prior to visit.    PAST MEDICAL HISTORY: Past Medical History:  Diagnosis Date   Anxiety    Depression    Diabetes (HCC)    GERD (gastroesophageal reflux disease)    GERD (gastroesophageal reflux disease)    Hyperlipemia    Hyperlipidemia    Learning disability    Erika [name] [name] helps Erika with medical needs   Obesity    OSA treated with BiPAP  PAST SURGICAL HISTORY: Past Surgical History:  Procedure Laterality Date   ABDOMINAL HYSTERECTOMY     CHOLECYSTECTOMY     COLONOSCOPY WITH PROPOFOL N/A 03/19/2020   Procedure: COLONOSCOPY WITH PROPOFOL;  Surgeon: Willis Modena, MD;  Location: [facility];  Service: Endoscopy;  Laterality: N/A;    ESOPHAGOGASTRODUODENOSCOPY (EGD) WITH PROPOFOL N/A 07/07/2016   Procedure: ESOPHAGOGASTRODUODENOSCOPY (EGD) WITH PROPOFOL;  Surgeon: Carman Ching, MD;  Location: [facility];  Service: Endoscopy;  Laterality: N/A;    FAMILY HISTORY: Family History  Problem Relation Age of Onset   Hypertension Erika [name] [name]    Hyperlipidemia Erika [name] [name]    Diabetes Erika [name] [name]    Breast cancer Erika [name] [name] 6   Diabetes Father    Hypertension Father     SOCIAL HISTORY: Social History   Socioeconomic History   Marital status: Single    Spouse name: Not on file   Number of children: Not on file   Years of education: Not on file   Highest education level: Not on file  Occupational History   Not on file  Tobacco Use   Smoking status: Never   Smokeless tobacco: Never  Vaping Use   Vaping status: Never Used  Substance and Sexual Activity   Alcohol use: No    Alcohol/week: 0.0 standard drinks of alcohol   Drug use: No   Sexual activity: Not Currently    Birth control/protection: Surgical  Other Topics Concern   Not on file  Social History Narrative   Lives with Erika mom   Social Determinants of Health   Financial Resource Strain: Not on file  Food Insecurity: Not on file  Transportation Needs: Not on file  Physical Activity: Not on file  Stress: Not on file  Social Connections: Not on file  Intimate Partner Violence: Not on file     PHYSICAL EXAM  There were no vitals filed for this visit.    There is no height or weight on file to calculate BMI.  Generalized: Well developed, in no acute distress  Cardiology: normal rate and rhythm, no murmur noted Respiratory: clear to auscultation bilaterally  Neurological examination  Mentation: Alert oriented to time, place, history taking. Follows all commands speech and language fluent Cranial nerve II-XII: Pupils were equal round reactive to light. Extraocular movements were full, visual field were full  Motor: The motor testing reveals 5 over 5 strength  of all 4 extremities. Good symmetric motor tone is noted throughout.  Gait and station: Gait is normal.    DIAGNOSTIC DATA (LABS, IMAGING, TESTING) - I reviewed patient records, labs, notes, testing and imaging myself where available.      No data to display           No results found for: "WBC", "HGB", "HCT", "MCV", "PLT" No results found for: "NA", "K", "CL", "CO2", "GLUCOSE", "BUN", "CREATININE", "CALCIUM", "PROT", "ALBUMIN", "AST", "ALT", "ALKPHOS", "BILITOT", "GFRNONAA", "GFRAA" No results found for: "CHOL", "HDL", "LDLCALC", "LDLDIRECT", "TRIG", "CHOLHDL" No results found for: "HGBA1C" No results found for: "VITAMINB12" No results found for: "TSH"   ASSESSMENT AND PLAN 47 y.o. year old female  has a past medical history of Anxiety, Depression, Diabetes (HCC), GERD (gastroesophageal reflux disease), GERD (gastroesophageal reflux disease), Hyperlipemia, Hyperlipidemia, Learning disability, Obesity, and OSA treated with BiPAP. here with   No diagnosis found.    SHIANE REHRER is doing well on BiPAP therapy. She does continue to have a large leak, however, AHI is ar goal. I have educated Erika and Erika [name] Erika [name] [name] of ways to monitor  for and correct leak at home. She was encouraged to continue using BiPAP nightly and for greater than 4 hours each night. We will update supply orders as indicated. Risks of untreated sleep apnea review and education materials provided. Healthy lifestyle habits encouraged. She will follow up in 1 year, sooner if needed. She verbalizes understanding and agreement with this plan.    No orders of the defined types were placed in this encounter.     No orders of the defined types were placed in this encounter.     Christena Deem 01/19/2023, 4:24 PM [facility] [address], [address] [place], [place] [address] [phone]

## 2023-01-25 ENCOUNTER — Ambulatory Visit: Payer: Medicaid Other | Admitting: Family Medicine

## 2023-01-25 DIAGNOSIS — G4733 Obstructive sleep apnea (adult) (pediatric): Secondary | ICD-10-CM

## 2023-02-08 NOTE — Progress Notes (Unsigned)
PATIENT: JAMICA KUWAHARA DOB: 1975-11-20  REASON FOR VISIT: follow up HISTORY FROM: patient  No chief complaint on file.   HISTORY OF PRESENT ILLNESS:  02/08/23 ALL:  Franca returns for follow up for OSA on BiPAP.     01/18/2022 ALL: Kyerra returns for follow up for OSA on BiPAP. She continues to do well. She is using BiPAP every night for about 7-8 hours. She sleeps well. She denies concerns with machine or supplies. She has changed her mask but continues to have a leak. She does not seem bothered by leak and AHI is well managed.     01/13/2021 ALL: Tacoya returns for follow up for OSA on BiPAP. She continues to do very well on therapy. She is using machine every night. She reports sleeping very well. She has not noted any concerns of leak. She feels mask is comfortable and fits well. She denies concerns. She received a great report with last eye exam.     01/08/2020 ALL:  LYNNZIE OSHIELDS is a 47 y.o. female here today for follow up for OSA on BiPAP.  She is doing very well on BiPAP therapy. She presents today with her mother who states that she is sleeping very well. They are excited that she is doing so well. She wakes feeling refreshed. She is able to work without becoming sleepy.   Compliance report dated 12/09/2019 through 01/07/2020 reveals that she used BiPAP 29 of the past 30 days for compliance of 97%.  She used BiPAP greater than 4 hours all 29 days.  Average usage was 7 hours and 13 minutes.  Residual AHI was 4.1 with IPAP of 18 and EPAP of 14 cm water pressure.  Respiratory rate of 10.  There was a large leak noted in the 95th percentile of 76.6 L/min.  HISTORY: (copied from Dr Dohmeier's note on 11/09/2018)  11-09-2018 LEAIRA ARWINE is a 47 y.o. female with BiPAP treatment and MRDD , seen here as in a referral/ revisit  from Dr. Verl Dicker Partner , Dr. Rosemary Holms.  Patient was last seen 1 year ago, by Darrol Angel, NP. In the meantime she had a bite guard made,  which helped the patient with bruxism prevention but also seems to have had a positive effect on her breathing in conjunction with BiPAP 1 of many  risk factors has been her weight and her current BMI is 46.35.  However she has been a very compliant user of BiPAP for the treatment of obstructive sleep apnea and has no issues with the machine. Dr. Hulen Skains is her dentist, Miss Thomesha  has continued to be highly compliant she has used the machine 29 of the last 30 days including 08 November 2018.   Average user time is 6 hours and 27 minutes.  Her inspiratory pressure is 18 expiratory pressure 14 cmH2O and she uses a background SVT rate of 10 bpm so rate 10 breaths/min.  Residual AHI is 4.9 which is fine and she does have some major air leaks but the have been addressed with a new mask.   The AHI varies greatly from night to night.  The respiratory rate however states constant.  I do not need to make any adjustments in settings.    Interval history from 04 May 2017, I have the pleasure of meeting with Ms. Navar and her mother today for the first time since her sleep studies were performed.  The patient underwent a split-night polysomnography on 24 December 2016  and the study confirmed that he has a high degree of apnea with an AHI of 44.4/h of sleep.  This means that she has an apnea about every 80 seconds.  She spent all night in supine position so we could not determine if there was a positional component, and she did not reach REM sleep either.  She did not have significant low oxygen saturations none of prolonged duration however.  She was started on CPAP first was 5 cmH2O and was step-by-step titrated to 15 CPAP caused central apneas to emerge and did not reduce the overall apnea index.  The patient was therefore changed briefly to BiPAP but at that time the night was almost over.  Pressures of 15/10 centimeter and 17/12 centimeters were tried.  We asked the patient to return for a full night BiPAP  titration based on these limited results.  She also developed some periodic limb movements at night but were not present in her first study.  She returned for a full night titration on 30 November, BiPAP was now initiated at a low pressure and slowly advanced.  The first pressure was 8/5 cmH2O and she finally reached 17/13 cmH2O but her AHI was reduced to 3.8 apneas per hour of sleep from previously over 44.  Snoring also was alleviated at that higher pressure.  She was placed on a full facemask which I had initially not wanted but he tolerated a small size Simplus fullface mask very well.    Ms. Coller mother is actually more thrilled than the patient about the results.  She no longer hears her daughter snoring or have apnea, the machine is very quiet and it has improved her own sleep significantly.  [name] has been very compliant she has used the machine over 4 hours nightly for 26 out of the last 30 days, and she has used it on every of the last 30 days.  Average use of time is 5 hours and 49 minutes, the machine is set at 18/14 cmH2O BiPAP with a background respiratory rate of 10 breaths/min, her residual AHI is 0.0.  There are no more apneas.  She does have some high air leaks but these do not affect her sleep quality or the apnea control.  If troponin once I would change her to a nasal pillow or nasal mask.   Consult : She is according to her mother's observation snoring very loudly, for many years. She gained weight over the last 5 years, partially attributed to depression. The patient has a long psychiatric history, has a learning disability( Dr. Lolly Mustache)  She Gained weight over the last years due to psychotropic medication for the treatment of depression, she also has developed hypertension, and she was witnessed to have apnea after a medical procedure, but she cannot remember what kind of procedures this was. Sleep habits are as follows: Ms. Monares usually watches TV for the last hours of the  day before she goes to bed. Her bedtime is around 10 PM, and she retreats to her bedroom at this time. Her bedroom is cool quiet and dark. She sleeps on her side. She avoids the right side because of an ophthalmological condition. She wears a mask for eye protection to sleep. She wakes up frequently to go to the bathroom at night at least 2 times. She rises in the morning and 5:15. Usually she has got 6 hours of sleep by that time. She feels usually rested and restored in the morning but also reports a  dry mouth. She likes to chew gum to give her relief from the dry mouth. She also drinks a lot of water during the day, but has to be reminded.    Sleep medical history and family sleep history: There is no other known family history of sleep apnea, but Ms. Dagg has been observed reading irregular in her sleep. She has diabetes, HTN, super obesity. Abnormal EKG- "flutter " .  She is on a sleep walker, has no history of night terrors or enuresis. She never underwent tonsillectomy and has no history of neck surgery or trauma.   Social history: Ms. Stegemoller lives with her parents, she is usually the first one in bed and rises without resistance and the morning. She goes to an adult enrichment center, and enjoys it. She does not use tobacco in any form, she does not drink alcohol, caffeine use she does drink coffee, soda and ice tea. She does not drink more than 1 or 2 cups of caffeinated beverage a day. Mostly soda.   REVIEW OF SYSTEMS: Out of a complete 14 system review of symptoms, the patient complains only of the following symptoms, none and all other reviewed systems are negative.  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     ARIPiprazole (ABILIFY) 5 MG tablet Take 1 tablet (5 mg total) by mouth daily. 90 tablet 1   atorvastatin (LIPITOR) 20 MG tablet Take 20 mg by mouth at bedtime.     benazepril (LOTENSIN) 10 MG tablet Take 10 mg by mouth daily.     cholecalciferol (VITAMIN D) 1000 units tablet Take 1,000 Units by mouth daily.     famotidine (PEPCID) 20 MG tablet Take 20 mg by mouth 3 (three) times daily.     FARXIGA 10 MG TABS tablet Take 10 mg by mouth every morning.     ketoconazole (NIZORAL) 2 % cream Apply 1 application topically 2 (two) times daily.     loratadine (CLARITIN) 10 MG tablet Take 10 mg by mouth daily.     metFORMIN (GLUCOPHAGE) 1000 MG tablet 1,000 mg in the morning and at bedtime.     PAPAYA ENZYME PO Take 1 capsule by mouth 3 (three) times daily after meals.     sertraline (ZOLOFT) 100 MG tablet Take 1.5 tablets (150 mg total) by mouth daily. 135 tablet 1   temazepam (RESTORIL) 15 MG capsule Take 1 capsule (15 mg total) by mouth at bedtime. 90 capsule 1   No facility-administered medications prior to visit.    PAST MEDICAL HISTORY: Past Medical History:  Diagnosis Date   Anxiety    Depression    Diabetes (HCC)    GERD (gastroesophageal reflux disease)    GERD (gastroesophageal reflux disease)    Hyperlipemia    Hyperlipidemia    Learning disability    Mother helps her with medical needs   Obesity    OSA treated with BiPAP  PAST SURGICAL HISTORY: Past Surgical History:  Procedure Laterality Date   ABDOMINAL HYSTERECTOMY     CHOLECYSTECTOMY     COLONOSCOPY WITH PROPOFOL N/A 03/19/2020   Procedure: COLONOSCOPY WITH PROPOFOL;  Surgeon: Willis Modena, MD;  Location: [facility];  Service: Endoscopy;  Laterality: N/A;    ESOPHAGOGASTRODUODENOSCOPY (EGD) WITH PROPOFOL N/A 07/07/2016   Procedure: ESOPHAGOGASTRODUODENOSCOPY (EGD) WITH PROPOFOL;  Surgeon: Carman Ching, MD;  Location: [facility];  Service: Endoscopy;  Laterality: N/A;    FAMILY HISTORY: Family History  Problem Relation Age of Onset   Hypertension Mother    Hyperlipidemia Mother    Diabetes Mother    Breast cancer Mother 14   Diabetes Father    Hypertension Father     SOCIAL HISTORY: Social History   Socioeconomic History   Marital status: Single    Spouse name: Not on file   Number of children: Not on file   Years of education: Not on file   Highest education level: Not on file  Occupational History   Not on file  Tobacco Use   Smoking status: Never   Smokeless tobacco: Never  Vaping Use   Vaping status: Never Used  Substance and Sexual Activity   Alcohol use: No    Alcohol/week: 0.0 standard drinks of alcohol   Drug use: No   Sexual activity: Not Currently    Birth control/protection: Surgical  Other Topics Concern   Not on file  Social History Narrative   Lives with her mom   Social Determinants of Health   Financial Resource Strain: Not on file  Food Insecurity: Not on file  Transportation Needs: Not on file  Physical Activity: Not on file  Stress: Not on file  Social Connections: Not on file  Intimate Partner Violence: Not on file     PHYSICAL EXAM  There were no vitals filed for this visit.    There is no height or weight on file to calculate BMI.  Generalized: Well developed, in no acute distress  Cardiology: normal rate and rhythm, no murmur noted Respiratory: clear to auscultation bilaterally  Neurological examination  Mentation: Alert oriented to time, place, history taking. Follows all commands speech and language fluent Cranial nerve II-XII: Pupils were equal round reactive to light. Extraocular movements were full, visual field were full  Motor: The motor testing reveals 5 over 5 strength  of all 4 extremities. Good symmetric motor tone is noted throughout.  Gait and station: Gait is normal.    DIAGNOSTIC DATA (LABS, IMAGING, TESTING) - I reviewed patient records, labs, notes, testing and imaging myself where available.      No data to display           No results found for: "WBC", "HGB", "HCT", "MCV", "PLT" No results found for: "NA", "K", "CL", "CO2", "GLUCOSE", "BUN", "CREATININE", "CALCIUM", "PROT", "ALBUMIN", "AST", "ALT", "ALKPHOS", "BILITOT", "GFRNONAA", "GFRAA" No results found for: "CHOL", "HDL", "LDLCALC", "LDLDIRECT", "TRIG", "CHOLHDL" No results found for: "HGBA1C" No results found for: "VITAMINB12" No results found for: "TSH"   ASSESSMENT AND PLAN 47 y.o. year old female  has a past medical history of Anxiety, Depression, Diabetes (HCC), GERD (gastroesophageal reflux disease), GERD (gastroesophageal reflux disease), Hyperlipemia, Hyperlipidemia, Learning disability, Obesity, and OSA treated with BiPAP. here with   No diagnosis found.    AQUANETTA DARCEY is doing well on BiPAP therapy. She does continue to have a large leak, however, AHI is ar goal. I have educated her and her mother of ways to monitor  for and correct leak at home. She was encouraged to continue using BiPAP nightly and for greater than 4 hours each night. We will update supply orders as indicated. Risks of untreated sleep apnea review and education materials provided. Healthy lifestyle habits encouraged. She will follow up in 1 year, sooner if needed. She verbalizes understanding and agreement with this plan.    No orders of the defined types were placed in this encounter.     No orders of the defined types were placed in this encounter.     Shawnie Dapper, FNP-C 02/08/2023, 8:17 AM [facility] [address] [place], [place] [address] [phone]

## 2023-02-08 NOTE — Patient Instructions (Signed)
Please continue using your BiPAP regularly. While your insurance requires that you use BiPAP at least 4 hours each night on 70% of the nights, I recommend, that you not skip any nights and use it throughout the night if you can. Getting used to BiPAP and staying with the treatment long term does take time and patience and discipline. Untreated obstructive sleep apnea when it is moderate to severe can have an adverse impact on cardiovascular health and raise her risk for heart disease, arrhythmias, hypertension, congestive heart failure, stroke and diabetes. Untreated obstructive sleep apnea causes sleep disruption, nonrestorative sleep, and sleep deprivation. This can have an impact on your day to day functioning and cause daytime sleepiness and impairment of cognitive function, memory loss, mood disturbance, and problems focussing. Using BiPAP regularly can improve these symptoms.  We will update supply orders, today. You are eligible for a new machine. I will order a repeat sleep study that you will do at home. Please listen out for a call from our sleep lab staff to schedule. Once you complete study, our sleep doctors with evaluate the data and we will use that to order a new machine as indicated. This process could take a few weeks. Once new machine is ordered, you will hear back from your DME company to schedule set up of your new machine.   We will need to see you back within 61-90 days following set up of your new BiPAP to document compliance as required by your insurance company. Please call the office to schedule follow up when you receive your new machine. Please feel free to reach out with any questions or concerns.

## 2023-02-09 ENCOUNTER — Ambulatory Visit: Payer: MEDICAID | Admitting: Family Medicine

## 2023-02-09 ENCOUNTER — Encounter: Payer: Self-pay | Admitting: Family Medicine

## 2023-02-09 VITALS — BP 119/76 | HR 83 | Ht 64.0 in | Wt 245.0 lb

## 2023-02-09 DIAGNOSIS — G4733 Obstructive sleep apnea (adult) (pediatric): Secondary | ICD-10-CM | POA: Diagnosis not present

## 2023-02-14 ENCOUNTER — Other Ambulatory Visit: Payer: Self-pay | Admitting: Family Medicine

## 2023-02-14 DIAGNOSIS — G4733 Obstructive sleep apnea (adult) (pediatric): Secondary | ICD-10-CM

## 2023-03-07 ENCOUNTER — Telehealth (HOSPITAL_COMMUNITY): Payer: Self-pay | Admitting: *Deleted

## 2023-03-07 ENCOUNTER — Other Ambulatory Visit (HOSPITAL_COMMUNITY): Payer: Self-pay | Admitting: *Deleted

## 2023-03-07 DIAGNOSIS — F419 Anxiety disorder, unspecified: Secondary | ICD-10-CM

## 2023-03-07 DIAGNOSIS — F200 Paranoid schizophrenia: Secondary | ICD-10-CM

## 2023-03-07 MED ORDER — TEMAZEPAM 15 MG PO CAPS: 15.0000 mg | ORAL_CAPSULE | Freq: Every day | ORAL | 0 refills | Status: DC

## 2023-03-07 NOTE — Telephone Encounter (Signed)
 Pt's mother, Erika [name], called requesting a refill of the Temazepam 15 mg at bedtime. Pt last visit was on 09/15/22 with script sent to pharmacy for #90 with 1 refill. Pt has an upcoming appointment on 03/17/23. Please review and advise.

## 2023-03-07 NOTE — Telephone Encounter (Signed)
 Yes mother says she's completely out. Will call pharmacy.

## 2023-03-07 NOTE — Telephone Encounter (Signed)
 If she is out of the medication we can provide 1 week supply but not the full prescription

## 2023-03-17 ENCOUNTER — Telehealth (HOSPITAL_COMMUNITY): Payer: MEDICAID | Admitting: Psychiatry

## 2023-03-17 ENCOUNTER — Encounter (HOSPITAL_COMMUNITY): Payer: Self-pay | Admitting: Psychiatry

## 2023-03-17 VITALS — Wt 254.0 lb

## 2023-03-17 DIAGNOSIS — F200 Paranoid schizophrenia: Secondary | ICD-10-CM

## 2023-03-17 DIAGNOSIS — F419 Anxiety disorder, unspecified: Secondary | ICD-10-CM | POA: Diagnosis not present

## 2023-03-17 DIAGNOSIS — F5101 Primary insomnia: Secondary | ICD-10-CM

## 2023-03-17 MED ORDER — SERTRALINE HCL 100 MG PO TABS: 150.0000 mg | ORAL_TABLET | Freq: Every day | ORAL | 1 refills | Status: DC

## 2023-03-17 MED ORDER — ARIPIPRAZOLE 5 MG PO TABS: 5.0000 mg | ORAL_TABLET | Freq: Every day | ORAL | 1 refills | Status: DC

## 2023-03-17 MED ORDER — TEMAZEPAM 15 MG PO CAPS: 15.0000 mg | ORAL_CAPSULE | Freq: Every day | ORAL | 5 refills | Status: DC

## 2023-03-17 NOTE — Progress Notes (Signed)
[facility] MD Virtual Progress Note   Patient Location: Home Provider Location: Office  I connect with patient by telephone and verified that I am speaking with correct person by using two identifiers. I discussed the limitations of evaluation and management by telemedicine and the availability of in person appointments. I also discussed with the patient that there may be a patient responsible charge related to this service. The patient expressed understanding and agreed to proceed.  Erika [name] [id number] 48 y.o.  03/17/2023 3:49 PM  History of Present Illness:  Patient is evaluated by phone session.  She do not know how to do video session but promised to have in person visit next time.  She reported things are going very well.  Her mother also reported that she is taking all the medication except temazepam which she has a hard time getting refills on time.  Sometimes she has to buy 15 tablets out of her pocket.  She is not sure since the insurance changed to [facility] it has been a problem.  Patient denies any paranoia, hallucination, anger.  She had a good Christmas.  She really enjoy time with the family.  She denies any suicidal thoughts or homicidal thoughts.  Recently she had a visit with her primary care and no changes in the medication.  Her appetite is okay.  Her weight is stable.  Patient denies drinking or using any illegal substances.  She feels the current medicine is working and denies any panic attack.  She does require temazepam for her sleep.  Past Psychiatric History: H/O psychosis since teens. H/O at least 3 inpatient at [facility] due to paranoia and noncompliant with medication. H/O taking multiple meds which gradually reduced and discontinued. No h/o suicidal attempt.        Outpatient Encounter Medications as of 03/17/2023  Medication Sig   Accu-Chek FastClix Lancets MISC USE TO CHECK GLUCOSE TWICE DAILY   ACCU-CHEK GUIDE test strip USE 1 TEST STRIP TO TEST  BLOOD GLUCOSE ONCE DAILY AS DIRECTED   acetaminophen (TYLENOL) 500 MG tablet Take 1,000 mg by mouth every 6 (six) hours as needed for moderate pain or headache.   ARIPiprazole (ABILIFY) 5 MG tablet Take 1 tablet (5 mg total) by mouth daily.   atorvastatin (LIPITOR) 20 MG tablet Take 20 mg by mouth at bedtime.   benazepril (LOTENSIN) 10 MG tablet Take 10 mg by mouth daily.   cholecalciferol (VITAMIN D) 1000 units tablet Take 1,000 Units by mouth daily.   famotidine (PEPCID) 20 MG tablet Take 20 mg by mouth 3 (three) times daily.   FARXIGA 10 MG TABS tablet Take 10 mg by mouth every morning.   ketoconazole (NIZORAL) 2 % cream Apply 1 application topically 2 (two) times daily.   loratadine (CLARITIN) 10 MG tablet Take 10 mg by mouth daily.   metFORMIN (GLUCOPHAGE) 1000 MG tablet 1,000 mg in the morning and at bedtime.   PAPAYA ENZYME PO Take 1 capsule by mouth 3 (three) times daily after meals.   sertraline (ZOLOFT) 100 MG tablet Take 1.5 tablets (150 mg total) by mouth daily.   temazepam (RESTORIL) 15 MG capsule Take 1 capsule (15 mg total) by mouth at bedtime.   No facility-administered encounter medications on file as of 03/17/2023.    No results found for this or any previous visit (from the past 2160 hours).   Psychiatric Specialty Exam: Physical Exam  Review of Systems  Weight 254 lb (115.2 kg).There is no height or weight  on file to calculate BMI.  General Appearance: NA  Eye Contact:  NA  Speech:  Slow  Volume:  Decreased  Mood:  Euthymic  Affect:  Appropriate  Thought Process:  Descriptions of Associations: Intact  Orientation:  [name] (Time, Place, and Person)  Thought Content:  WDL  Suicidal Thoughts:  No  Homicidal Thoughts:  No  Memory:  Immediate;   Good Recent;   Fair Remote;   Fair  Judgement:  Intact  Insight:  Present  Psychomotor Activity:  NA  Concentration:  Concentration: Fair and Attention Span: Fair  Recall:  [employer] of Knowledge:  Fair  Language:   Good  Akathisia:  No  Handed:  Right  AIMS (if indicated):     Assets:  Communication Skills Desire for Improvement Housing Resilience Social Support  ADL's:  Intact  Cognition:  WNL  Sleep:  better     Assessment/Plan: Paranoid schizophrenia (HCC) - Plan: temazepam (RESTORIL) 15 MG capsule, sertraline (ZOLOFT) 100 MG tablet, ARIPiprazole (ABILIFY) 5 MG tablet  Anxiety - Plan: temazepam (RESTORIL) 15 MG capsule, sertraline (ZOLOFT) 100 MG tablet  Primary insomnia - Plan: temazepam (RESTORIL) 15 MG capsule  Patient is stable on current medication.  I emphasized if she cannot get prescription of temazepam as written then she should call us back.  She reported her sleep is much better with the temazepam.  We will continue temazepam 15 mg at bedtime, Zoloft 150 mg daily and Abilify 5 mg daily.  Recommended to call us back if she is any question or any concern.  Patient like to have a follow-up in person.  Return to office in 6 months unless needing earlier.   Follow Up Instructions:     I discussed the assessment and treatment plan with the patient. The patient was provided an opportunity to ask questions and all were answered. The patient agreed with the plan and demonstrated an understanding of the instructions.   The patient was advised to call back or seek an in-person evaluation if the symptoms worsen or if the condition fails to improve as anticipated.    Collaboration of Care: Other provider involved in patient's care AEB notes are available in epic to review  Patient/Guardian was advised Release of Information must be obtained prior to any record release in order to collaborate their care with an outside provider. Patient/Guardian was advised if they have not already done so to contact the registration department to sign all necessary forms in order for [place] to release information regarding their care.   Consent: Patient/Guardian gives verbal consent for treatment and  assignment of benefits for services provided during this visit. Patient/Guardian expressed understanding and agreed to proceed.     I provided 18 minutes of non face to face time during this encounter.  Note: This document was prepared by [employer] voice dictation technology and any errors that results from this process are unintentional.    Cleotis Nipper, MD 03/17/2023

## 2023-03-22 ENCOUNTER — Telehealth (HOSPITAL_COMMUNITY): Payer: Self-pay | Admitting: *Deleted

## 2023-03-22 NOTE — Telephone Encounter (Signed)
PA for Temazepam 15 mg capsules at bedtime has been submitted to [facility] via the CoverMyMeds portal. Awaiting determination.

## 2023-03-23 NOTE — Telephone Encounter (Signed)
Request was N/A - they need it to be submitted by fax or mail since it is a [employer]. I will print the form and submit today

## 2023-03-23 NOTE — Telephone Encounter (Signed)
Faxed form to Medicaid today

## 2023-03-29 ENCOUNTER — Ambulatory Visit: Payer: MEDICAID | Admitting: Neurology

## 2023-03-29 DIAGNOSIS — G4733 Obstructive sleep apnea (adult) (pediatric): Secondary | ICD-10-CM

## 2023-03-29 DIAGNOSIS — M2619 Other specified anomalies of jaw-cranial base relationship: Secondary | ICD-10-CM

## 2023-03-29 DIAGNOSIS — G4710 Hypersomnia, unspecified: Secondary | ICD-10-CM

## 2023-03-30 NOTE — Progress Notes (Unsigned)
Marland Kitchen

## 2023-04-08 ENCOUNTER — Other Ambulatory Visit: Payer: Self-pay | Admitting: Neurology

## 2023-04-08 DIAGNOSIS — G4710 Hypersomnia, unspecified: Secondary | ICD-10-CM

## 2023-04-08 DIAGNOSIS — F70 Mild intellectual disabilities: Secondary | ICD-10-CM

## 2023-04-08 DIAGNOSIS — G4733 Obstructive sleep apnea (adult) (pediatric): Secondary | ICD-10-CM

## 2023-04-08 DIAGNOSIS — Z789 Other specified health status: Secondary | ICD-10-CM

## 2023-04-08 NOTE — Progress Notes (Signed)
 [facility]  Erika [name] 48 year old female 1975-05-03     HOME SLEEP TEST REPORT ( by Watch PAT)   STUDY DATA return on 04-05-2023:     ORDERING CLINICIAN:  Lomax, [name] - NP  neuro REFERRING CLINICIAN:    CLINICAL INFORMATION/HISTORY: patient of Dr Erika Client, MD and Erika Gell, MD. 02-09-2023: follow up for OSA on BiPAP. She continues to do well. She is using BiPAP every night for about 7-8 hours. She sleeps well. She denies concerns with machine or supplies. She has changed her mask but continues to have a leak. She does not seem bothered by leak and AHI is well managed.  Mask type  unchanged. AHI was 1.9/h.  BiPAP 18/ 14 cm water with ST 10.       Epworth sleepiness score: 3/ 24. FSS: 11/ 63    BMI:  42 kg/m   Neck Circumference: n/n   FINDINGS:   Sleep Summary: following AASM criteria.    Total Recording Time (hours, min):     7 hours 57 minutes, beginning at 10 PM   Total Sleep Time (hours, min):     6 hours 56 minutes            Percent REM (%):      22%                                  Respiratory Indices:   Calculated pAHI (per hour):    43.4/h                         REM pAHI:     64.8/h                                            NREM pAHI:      39.5/h                        Supine AHI:    41.6/h versus a nonsupine index of 44.2/h.  Snoring levels reached a mean volume of 46 dB which is considered loud and snoring was present for three quarters of the total recorded time.                                               Oxygen Saturation Statistics:   Oxygen Saturation (%) Mean:     94%                O2 Saturation Range (%):      Between the nadir at 85 and a maximum saturation of 97%                                 O2 Saturation (minutes) <89%:     1 minute      Pulse Rate Statistics:   Pulse Mean (bpm): 93 bpm               Pulse Range:      From 71 through 218 bpm.  Please note  that the home sleep test cannot give  cardiac rhythm information.           IMPRESSION:  This HST confirms the presence of severe obstructive sleep apnea.   Home sleep tests are not good at detecting central apnea, but in this case no central apnea was detected. No significant hypoxemia was detected.  Loud snoring was present. The sleep apnea is not positional dependent, but REM sleep exacerbated.   RECOMMENDATION:  The patient is an experienced user of BiPAP ST 10 and setting of 18/14 cmH2O, but has experienced high air leakage.  However her overall without has been that of a significant reduction in apnea and hypopneas and I do not think that we need to change her pressures but she should be refitted. I will also ask the durable medical equipment company to introduce her to a chinstrap as this may be an air leak related to oral outflow. Please keep in mind that a severe overbite is present, retrognathia.   BIPAP ST as above will be ordered - sleep clinic follow up in 2-3 months after receiving device; and thereafter, yearly CPAP clinic follow-up is advisable. Compliance is defined as 4 hours or more of nightly use.  Weight loss is to be encouraged Avoidance of medications with muscle relaxant properties. Avoidance of ingestion of alcohol prior to sleeping (if applicable). Improvement of nasal patency if indicated.  As in all obstructive apneas we recommend to reduce the body mass index.  A body mass reduction of 12 pounds may correlate to an AHI reduction by 1 point.  The patient can follow-up with [name] Lomax for compliance documentation between day 60 and day 90 on her new BiPAP machine.    INTERPRETING PHYSICIAN:   Erika Gores, MD  Diplomat, American Board of Psychiatry and Neurology  Diplomat, Biomedical Engineer of Sleep Medicine Wellsite Geologist of [facility]

## 2023-04-13 ENCOUNTER — Telehealth: Payer: Self-pay

## 2023-04-13 NOTE — Telephone Encounter (Addendum)
I called pt/mother, per DPR. I advised pt that Dr. Doristine Locks reviewed their sleep study results and found that pt has sever OSA. Dr. Dian Queen recommends that pt continue bipap. I reviewed PAP compliance expectations with the pt. Pt is agreeable to starting a CPAP. I advised pt that an order will be sent to a DME, [facility], and [employer] will call the pt within about one week after they file with the pt's insurance. [employer] will show the pt how to use the machine, fit for masks, and troubleshoot the CPAP if needed. A follow up appt was made for insurance purposes with AL 07/07/23 8am.  Pt verbalized understanding to arrive 15 minutes early and bring their CPAP. Pt verbalized understanding of results. Pt had no questions at this time but was encouraged to call back if questions arise. I have sent the order to [employer] and have received confirmation that they have received the order.

## 2023-04-13 NOTE — Telephone Encounter (Signed)
-----   Message from Erika [name] sent at 04/12/2023  4:31 PM EST ----- Please let her know that that Dr Vickey Huger has read her study results that shows continued severe sleep apnea. She ordered a new BiPAP machine and a chin strap. She will need to make sure she has a good mask seal when getting set up with new machine and we need to see her bac 61-90 days following set up. TY.

## 2023-04-13 NOTE — Telephone Encounter (Addendum)
Bobbye Morton, CMA  [place] Parents Candie Chroman, Efraim Kaufmann; Angus Seller, [place] New orders have been placed for the above pt, DOB: Oct 09, 1975 Thanks  New, Maryruth Bun, Abbe Amsterdam, CMA; Julius Bowels; Kathe Becton; 1 other Received, thank you!

## 2023-07-05 NOTE — Progress Notes (Signed)
 PATIENT: Erika [name] DOB: 04/06/1975  REASON FOR VISIT: follow up HISTORY FROM: patient  Chief Complaint  Patient presents with   Follow-up    Pt in 1 with mom Pt here for cpap f/u Mom is asking aboit patient a new pap machine set up date 2018 last sleep study 04/2023     HISTORY OF PRESENT ILLNESS:  07/07/23 ALL:  Erika [name] returns for follow up for OSA on BiPAP. We repeated HST 03/2023 showing AHI 43.4/hr and O2 nadir 85%. New BiPAP ordered. Since, she reports not hearing back to set up new BiPAP. She has continued to use her old machine. Erika [name] (pts mom) called to inquire but was told by DME that they did not receive orders from our office. She is doing well with old machine. Sleeping well. No concerns, today.     02/09/2023 ALL:  Erika [name] returns for follow up for OSA on BiPAP. She continues to do very well on therapy. She is using BiPAP nightly for about 6-7 hours. She reports sleeping well. She does have a leak but not bothered by this. AHI well managed. She continues to work at [employer].     01/18/2022 ALL: Erika [name] returns for follow up for OSA on BiPAP. She continues to do well. She is using BiPAP every night for about 7-8 hours. She sleeps well. She denies concerns with machine or supplies. She has changed her mask but continues to have a leak. She does not seem bothered by leak and AHI is well managed.     01/13/2021 ALL: Erika [name] returns for follow up for OSA on BiPAP. She continues to do very well on therapy. She is using machine every night. She reports sleeping very well. She has not noted any concerns of leak. She feels mask is comfortable and fits well. She denies concerns. She received a great report with last eye exam.     01/08/2020 ALL:  Erika [name] is a 48 y.o. female here today for follow up for OSA on BiPAP.  She is doing very well on BiPAP therapy. She presents today with her mother who states that she is sleeping very well. They are excited that she  is doing so well. She wakes feeling refreshed. She is able to work without becoming sleepy.   Compliance report dated 12/09/2019 through 01/07/2020 reveals that she used BiPAP 29 of the past 30 days for compliance of 97%.  She used BiPAP greater than 4 hours all 29 days.  Average usage was 7 hours and 13 minutes.  Residual AHI was 4.1 with IPAP of 18 and EPAP of 14 cm water pressure.  Respiratory rate of 10.  There was a large leak noted in the 95th percentile of 76.6 L/min.  HISTORY: (copied from Dr Dohmeier's note on 11/09/2018)  11-09-2018 Erika [name] is a 48 y.o. female with BiPAP treatment and MRDD , seen here as in a referral/ revisit  from Dr. Maida Sciara Partner , Dr. Filiberto Hug.  Patient was last seen 1 year ago, by Moses Arenas, NP. In the meantime she had a bite guard made, which helped the patient with bruxism prevention but also seems to have had a positive effect on her breathing in conjunction with BiPAP 1 of many  risk factors has been her weight and her current BMI is 46.35.  However she has been a very compliant user of BiPAP for the treatment of obstructive sleep apnea and has no issues with the machine. Dr. Honor Lute  is her dentist, Miss Shanen  has continued to be highly compliant she has used the machine 29 of the last 30 days including 08 November 2018.   Average user time is 6 hours and 27 minutes.  Her inspiratory pressure is 18 expiratory pressure 14 cmH2O and she uses a background SVT rate of 10 bpm so rate 10 breaths/min.  Residual AHI is 4.9 which is fine and she does have some major air leaks but the have been addressed with a new mask.   The AHI varies greatly from night to night.  The respiratory rate however states constant.  I do not need to make any adjustments in settings.    Interval history from 04 May 2017, I have the pleasure of meeting with Erika [name] and her mother today for the first time since her sleep studies were performed.  The patient underwent a  split-night polysomnography on 24 December 2016 and the study confirmed that he has a high degree of apnea with an AHI of 44.4/h of sleep.  This means that she has an apnea about every 80 seconds.  She spent all night in supine position so we could not determine if there was a positional component, and she did not reach REM sleep either.  She did not have significant low oxygen saturations none of prolonged duration however.  She was started on CPAP first was 5 cmH2O and was step-by-step titrated to 15 CPAP caused central apneas to emerge and did not reduce the overall apnea index.  The patient was therefore changed briefly to BiPAP but at that time the night was almost over.  Pressures of 15/10 centimeter and 17/12 centimeters were tried.  We asked the patient to return for a full night BiPAP titration based on these limited results.  She also developed some periodic limb movements at night but were not present in her first study.  She returned for a full night titration on 30 November, BiPAP was now initiated at a low pressure and slowly advanced.  The first pressure was 8/5 cmH2O and she finally reached 17/13 cmH2O but her AHI was reduced to 3.8 apneas per hour of sleep from previously over 44.  Snoring also was alleviated at that higher pressure.  She was placed on a full facemask which I had initially not wanted but he tolerated a small size Simplus fullface mask very well.    Ms. Mcfalls mother is actually more thrilled than the patient about the results.  She no longer hears her daughter snoring or have apnea, the machine is very quiet and it has improved her own sleep significantly.  Erika [name] has been very compliant she has used the machine over 4 hours nightly for 26 out of the last 30 days, and she has used it on every of the last 30 days.  Average use of time is 5 hours and 49 minutes, the machine is set at 18/14 cmH2O BiPAP with a background respiratory rate of 10 breaths/min, her residual AHI is 0.0.   There are no more apneas.  She does have some high air leaks but these do not affect her sleep quality or the apnea control.  If troponin once I would change her to a nasal pillow or nasal mask.   Consult : She is according to her mother's observation snoring very loudly, for many years. She gained weight over the last 5 years, partially attributed to depression. The patient has a long psychiatric history, has a learning disability( Dr.  Arfeen)  She Gained weight over the last years due to psychotropic medication for the treatment of depression, she also has developed hypertension, and she was witnessed to have apnea after a medical procedure, but she cannot remember what kind of procedures this was. Sleep habits are as follows: Ms. Jalloh usually watches TV for the last hours of the day before she goes to bed. Her bedtime is around 10 PM, and she retreats to her bedroom at this time. Her bedroom is cool quiet and dark. She sleeps on her side. She avoids the right side because of an ophthalmological condition. She wears a mask for eye protection to sleep. She wakes up frequently to go to the bathroom at night at least 2 times. She rises in the morning and 5:15. Usually she has got 6 hours of sleep by that time. She feels usually rested and restored in the morning but also reports a dry mouth. She likes to chew gum to give her relief from the dry mouth. She also drinks a lot of water during the day, but has to be reminded.    Sleep medical history and family sleep history: There is no other known family history of sleep apnea, but Ms. Hensen has been observed reading irregular in her sleep. She has diabetes, HTN, super obesity. Abnormal EKG- "flutter " .  She is on a sleep walker, has no history of night terrors or enuresis. She never underwent tonsillectomy and has no history of neck surgery or trauma.   Social history: Ms. Hoiland lives with her parents, she is usually the first one in bed and rises  without resistance and the morning. She goes to an adult enrichment center, and enjoys it. She does not use tobacco in any form, she does not drink alcohol, caffeine use she does drink coffee, soda and ice tea. She does not drink more than 1 or 2 cups of caffeinated beverage a day. Mostly soda.   REVIEW OF SYSTEMS: Out of a complete 14 system review of symptoms, the patient complains only of the following symptoms, none and all other reviewed systems are negative.  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     ARIPiprazole  (ABILIFY ) 5 MG tablet Take 1 tablet (5 mg total) by mouth daily. 90 tablet 1   atorvastatin (LIPITOR) 20 MG tablet Take 20 mg by mouth at bedtime.     benazepril (LOTENSIN) 10 MG tablet Take 10 mg by mouth daily.     cholecalciferol (VITAMIN D) 1000 units tablet Take 1,000 Units by mouth daily.     famotidine (PEPCID) 20 MG tablet Take 20 mg by mouth 3 (three) times daily.     FARXIGA 10 MG TABS tablet Take 10 mg by mouth every morning.     ketoconazole (NIZORAL) 2 % cream Apply 1 application topically 2 (two) times daily.     loratadine (CLARITIN) 10 MG tablet Take 10 mg by mouth daily.  metFORMIN (GLUCOPHAGE) 1000 MG tablet 1,000 mg in the morning and at bedtime.     PAPAYA ENZYME PO Take 1 capsule by mouth 3 (three) times daily after meals.     sertraline  (ZOLOFT ) 100 MG tablet Take 1.5 tablets (150 mg total) by mouth daily. 135 tablet 1   temazepam  (RESTORIL ) 15  MG capsule Take 1 capsule (15 mg total) by mouth at bedtime. 30 capsule 5   No facility-administered medications prior to visit.    PAST MEDICAL HISTORY: Past Medical History:  Diagnosis Date   Anxiety    Depression    Diabetes (HCC)    GERD (gastroesophageal reflux disease)    GERD (gastroesophageal reflux disease)    Hyperlipemia    Hyperlipidemia    Learning disability    Mother helps her with medical needs   Obesity    OSA treated with BiPAP     PAST SURGICAL HISTORY: Past Surgical History:  Procedure Laterality Date   ABDOMINAL HYSTERECTOMY     CHOLECYSTECTOMY     COLONOSCOPY WITH PROPOFOL  N/A 03/19/2020   Procedure: COLONOSCOPY WITH PROPOFOL ;  Surgeon: Evangeline Hilts, MD;  Location: [facility];  Service: Endoscopy;  Laterality: N/A;   ESOPHAGOGASTRODUODENOSCOPY (EGD) WITH PROPOFOL  N/A 07/07/2016   Procedure: ESOPHAGOGASTRODUODENOSCOPY (EGD) WITH PROPOFOL ;  Surgeon: Jolinda Necessary, MD;  Location: [facility];  Service: Endoscopy;  Laterality: N/A;    FAMILY HISTORY: Family History  Problem Relation Age of Onset   Hypertension Mother    Hyperlipidemia Mother    Diabetes Mother    Breast cancer Mother 87   Diabetes Father    Hypertension Father    Sleep apnea Neg Hx     SOCIAL HISTORY: Social History   Socioeconomic History   Marital status: Single    Spouse name: Not on file   Number of children: Not on file   Years of education: Not on file   Highest education level: Not on file  Occupational History   Not on file  Tobacco Use   Smoking status: Never   Smokeless tobacco: Never  Vaping Use   Vaping status: Never Used  Substance and Sexual Activity   Alcohol use: No    Alcohol/week: 0.0 standard drinks of alcohol   Drug use: No   Sexual activity: Not Currently    Birth control/protection: Surgical  Other Topics Concern   Not on file  Social History Narrative   Lives with her mom   Pt works    Social Drivers of Corporate investment banker  Strain: Not on file  Food Insecurity: Not on file  Transportation Needs: Not on file  Physical Activity: Not on file  Stress: Not on file  Social Connections: Not on file  Intimate Partner Violence: Not on file     PHYSICAL EXAM  Vitals:   07/07/23 0755  BP: 115/75  Pulse: 83  Weight: 249 lb (112.9 kg)  Height: 5' (1.524 m)       Body mass index is 48.63 kg/m.  Generalized: Well developed, in no acute distress  Cardiology: normal rate and rhythm, no murmur noted Respiratory: clear to auscultation bilaterally  Neurological examination  Mentation: Alert oriented to time, place, history taking. Follows all commands speech and language fluent Cranial nerve II-XII: Pupils were equal round reactive to light. Extraocular movements were full, visual field were full  Motor: The motor testing reveals 5 over 5 strength of all 4 extremities. Good symmetric motor tone is noted throughout.  Gait and station: Gait  is normal.    DIAGNOSTIC DATA (LABS, IMAGING, TESTING) - I reviewed patient records, labs, notes, testing and imaging myself where available.      No data to display           No results found for: "WBC", "HGB", "HCT", "MCV", "PLT" No results found for: "NA", "K", "CL", "CO2", "GLUCOSE", "BUN", "CREATININE", "CALCIUM", "PROT", "ALBUMIN", "AST", "ALT", "ALKPHOS", "BILITOT", "GFRNONAA", "GFRAA" No results found for: "CHOL", "HDL", "LDLCALC", "LDLDIRECT", "TRIG", "CHOLHDL" No results found for: "HGBA1C" No results found for: "VITAMINB12" No results found for: "TSH"   ASSESSMENT AND PLAN 47 y.o. year old female  has a past medical history of Anxiety, Depression, Diabetes (HCC), GERD (gastroesophageal reflux disease), GERD (gastroesophageal reflux disease), Hyperlipemia, Hyperlipidemia, Learning disability, Obesity, and OSA treated with BiPAP. here with     ICD-10-CM   1. OSA treated with BiPAP  G47.33        ROYANN GOTH is doing well on BiPAP therapy.  Compliance report shows excellent compliance. She does continue to have a large leak, however, AHI is at goal. I have educated her and her mother of ways to monitor for and correct leak at home. She was encouraged to continue using BiPAP nightly and for greater than 4 hours each night. We will contact DME to request orders for new BiPAP placed 04/08/2023 be processed asap. She will follow up in 61-90 days following set up of new machine. She and mom verbalize understanding and agreement with this plan.    No orders of the defined types were placed in this encounter.     No orders of the defined types were placed in this encounter.     Terrilyn Fick, FNP-C 07/07/2023, 8:14 AM [facility] [address], [address] [place], [place] [address] [phone]

## 2023-07-05 NOTE — Patient Instructions (Incomplete)

## 2023-07-06 NOTE — Progress Notes (Signed)
 Erika [name]

## 2023-07-07 ENCOUNTER — Encounter: Payer: Self-pay | Admitting: Family Medicine

## 2023-07-07 ENCOUNTER — Ambulatory Visit: Payer: MEDICAID | Admitting: Family Medicine

## 2023-07-07 VITALS — BP 115/75 | HR 83 | Ht 60.0 in | Wt 249.0 lb

## 2023-07-07 DIAGNOSIS — G4733 Obstructive sleep apnea (adult) (pediatric): Secondary | ICD-10-CM | POA: Diagnosis not present

## 2023-07-27 ENCOUNTER — Telehealth: Payer: Self-pay | Admitting: Family Medicine

## 2023-07-27 NOTE — Telephone Encounter (Signed)
 Initial CPAP f/u needed to be scheduled, due to this being a time sensitive appointment, and there not being any other available slots a hosp f/u was used so pt could be scheduled.  Pt's mother has been told to bring CPAP and power cord to appointment, pt will begin using new CPAP tonight 07-27-23

## 2023-09-12 ENCOUNTER — Telehealth (HOSPITAL_COMMUNITY): Payer: MEDICAID | Admitting: Psychiatry

## 2023-09-15 ENCOUNTER — Ambulatory Visit (HOSPITAL_COMMUNITY): Payer: MEDICAID | Admitting: Psychiatry

## 2023-09-22 ENCOUNTER — Other Ambulatory Visit: Payer: Self-pay

## 2023-09-22 ENCOUNTER — Encounter (HOSPITAL_COMMUNITY): Payer: Self-pay | Admitting: Psychiatry

## 2023-09-22 ENCOUNTER — Ambulatory Visit (HOSPITAL_BASED_OUTPATIENT_CLINIC_OR_DEPARTMENT_OTHER): Payer: MEDICAID | Admitting: Psychiatry

## 2023-09-22 DIAGNOSIS — F5101 Primary insomnia: Secondary | ICD-10-CM | POA: Diagnosis not present

## 2023-09-22 DIAGNOSIS — F419 Anxiety disorder, unspecified: Secondary | ICD-10-CM

## 2023-09-22 DIAGNOSIS — F200 Paranoid schizophrenia: Secondary | ICD-10-CM

## 2023-09-22 MED ORDER — ARIPIPRAZOLE 5 MG PO TABS: 5.0000 mg | ORAL_TABLET | Freq: Every day | ORAL | 1 refills | Status: DC

## 2023-09-22 MED ORDER — SERTRALINE HCL 100 MG PO TABS: 150.0000 mg | ORAL_TABLET | Freq: Every day | ORAL | 1 refills | Status: DC

## 2023-09-22 MED ORDER — TEMAZEPAM 15 MG PO CAPS: 15.0000 mg | ORAL_CAPSULE | Freq: Every day | ORAL | 5 refills | Status: DC

## 2023-09-22 NOTE — Progress Notes (Signed)
 [facility] MD/PA/NP OP Progress Note  09/22/2023 2:12 PM Erika [name]  MRN:  [id number]  Chief Complaint:  Chief Complaint  Patient presents with   Follow-up   Medication Refill   HPI: Patient came today to the office with her mother for her appointment.  I have not seen in person in a while and patient and his mother was happy to see the provider in the office.  Mother reported patient is doing very well and denies any hallucination or any irritability.  Patient continues to work at [employer] 4 hours a day 5 days a week.  Patient denies any issues at work.  She denies any agitation, anger, mania or any crying spells.  Her appetite is okay.  She sleeps good.  When she come home she usually play with the dog.  Recently had a visit with primary care Dr. Valma and hemoglobin A1c 6.8 which is stable.  Her other labs are normal.  Patient denies drinking or using any illegal substances.  Her weight is unchanged from the past.  She denies any major panic attack or any feeling of hopelessness.  She has no issue getting her medication from the pharmacy.  Visit Diagnosis:    ICD-10-CM   1. Paranoid schizophrenia (HCC)  F20.0 sertraline  (ZOLOFT ) 100 MG tablet    temazepam  (RESTORIL ) 15 MG capsule    ARIPiprazole  (ABILIFY ) 5 MG tablet    2. Anxiety  F41.9 sertraline  (ZOLOFT ) 100 MG tablet    temazepam  (RESTORIL ) 15 MG capsule    3. Primary insomnia  F51.01 temazepam  (RESTORIL ) 15 MG capsule      Past Psychiatric History: Reviewed H/O psychosis since teens. H/O at least 3 inpatient at [facility] due to paranoia and noncompliant with medication. H/O taking multiple meds which gradually reduced and discontinued. No h/o suicidal attempt.         Past Medical History:  Diagnosis Date   Anxiety    Depression    Diabetes (HCC)    GERD (gastroesophageal reflux disease)    GERD (gastroesophageal reflux disease)    Hyperlipemia    Hyperlipidemia    Learning disability    Mother helps her with medical needs    Obesity    OSA treated with BiPAP     Past Surgical History:  Procedure Laterality Date   ABDOMINAL HYSTERECTOMY     CHOLECYSTECTOMY     COLONOSCOPY WITH PROPOFOL  N/A 03/19/2020   Procedure: COLONOSCOPY WITH PROPOFOL ;  Surgeon: Burnette Fallow, MD;  Location: [facility];  Service: Endoscopy;  Laterality: N/A;   ESOPHAGOGASTRODUODENOSCOPY (EGD) WITH PROPOFOL  N/A 07/07/2016   Procedure: ESOPHAGOGASTRODUODENOSCOPY (EGD) WITH PROPOFOL ;  Surgeon: Celestia Agent, MD;  Location: [facility];  Service: Endoscopy;  Laterality: N/A;    Family Psychiatric History: Reviewed  Family History:  Family History  Problem Relation Age of Onset   Hypertension Mother    Hyperlipidemia Mother    Diabetes Mother    Breast cancer Mother 30   Diabetes Father    Hypertension Father    Sleep apnea Neg Hx     Social History:  Social History   Socioeconomic History   Marital status: Single    Spouse name: Not on file   Number of children: Not on file   Years of education: Not on file   Highest education level: Not on file  Occupational History   Not on file  Tobacco Use   Smoking status: Never   Smokeless tobacco: Never  Vaping Use   Vaping status: Never  Used  Substance and Sexual Activity   Alcohol use: No    Alcohol/week: 0.0 standard drinks of alcohol   Drug use: No   Sexual activity: Not Currently    Birth control/protection: Surgical  Other Topics Concern   Not on file  Social History Narrative   Lives with her mom   Pt works    Social Drivers of Corporate investment banker Strain: Not on file  Food Insecurity: Not on file  Transportation Needs: Not on file  Physical Activity: Not on file  Stress: Not on file  Social Connections: Not on file    Allergies:  Allergies  Allergen Reactions   Haldol [Haloperidol Decanoate] Other (See Comments)    NMS (neuroleptic malignant syndrome) , 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      Metabolic Disorder Labs: No results found for: HGBA1C,  MPG No results found for: PROLACTIN No results found for: CHOL, TRIG, HDL, CHOLHDL, VLDL, LDLCALC No results found for: TSH  Therapeutic Level Labs: No results found for: LITHIUM No results found for: VALPROATE No results found for: CBMZ  Current Medications: Current Outpatient Medications  Medication Sig Dispense Refill   Accu-Chek FastClix Lancets MISC USE TO CHECK GLUCOSE TWICE DAILY     ACCU-CHEK GUIDE test strip USE 1 TEST STRIP TO TEST BLOOD GLUCOSE ONCE DAILY AS DIRECTED     acetaminophen (TYLENOL) 500 MG tablet Take 1,000 mg by mouth every 6 (six) hours as needed for moderate pain or headache.     ARIPiprazole  (ABILIFY ) 5 MG tablet Take 1 tablet (5 mg total) by mouth daily. 90 tablet 1   atorvastatin (LIPITOR) 20 MG tablet Take 20 mg by mouth at bedtime.     benazepril (LOTENSIN) 10 MG tablet Take 10 mg by mouth daily.     cholecalciferol (VITAMIN D) 1000 units tablet Take 1,000 Units by mouth daily.     famotidine (PEPCID) 20 MG tablet Take 20 mg by mouth 3 (three) times daily.     FARXIGA 10 MG TABS tablet Take 10 mg by mouth every morning.     ketoconazole (NIZORAL) 2 % cream Apply 1 application topically 2 (two) times daily.     loratadine (CLARITIN) 10 MG tablet Take 10 mg by mouth daily.     metFORMIN (GLUCOPHAGE) 1000 MG tablet 1,000 mg in the morning and at bedtime.     PAPAYA ENZYME PO Take 1 capsule by mouth 3 (three) times daily after meals.     sertraline  (ZOLOFT ) 100 MG tablet Take 1.5 tablets (150 mg total) by mouth daily. 135 tablet 1   temazepam  (RESTORIL ) 15 MG capsule Take 1 capsule (15 mg total) by mouth at bedtime. 30 capsule 5   No current facility-administered medications for this visit.     Musculoskeletal: Strength & Muscle Tone: within normal limits Gait & Station: normal Patient leans: N/A  Psychiatric Specialty Exam: Review of Systems  Blood pressure (!) 150/78, pulse 91, height 5' 5 (1.651 m), weight 252 lb (114.3  kg).Body mass index is 41.93 kg/m.  General Appearance: Casual  Eye Contact:  Fair  Speech:  Slow  Volume:  Decreased  Mood:  Euthymic  Affect:  Constricted  Thought Process:  Descriptions of Associations: Intact  Orientation:  Full (Time, Place, and Person)  Thought Content: Logical   Suicidal Thoughts:  No  Homicidal Thoughts:  No  Memory:  Immediate;   Good Recent;   Good Remote;   Fair  Judgement:  Intact  Insight:  Present  Psychomotor Activity:  Decreased  Concentration:  Concentration: Fair and Attention Span: Fair  Recall:  [employer] of Knowledge: Fair  Language: Fair  Akathisia:  No  Handed:  Right  AIMS (if indicated): not done  Assets:  Communication Skills Desire for Improvement Housing Social Support Transportation  ADL's:  Intact  Cognition: WNL  Sleep:  Good   Screenings: Flowsheet Row Video Visit [facility] 09/02/[facility] Video Visit from 06/02/2020 in [facility] Admission (Discharged) from 03/19/2020 in [facility]  C-SSRS RISK CATEGORY No Risk No Risk No Risk     Assessment and Plan: Patient is stable on current medication.  No more issues getting the medication refilled at the pharmacy.  She like to have a note that her mental condition is stable to her employer.  Note provided.  Will continue Zoloft  150 mg daily, Abilify  5 mg daily and temazepam  15 mg at bedtime.  Encourage walking and watching her calorie intake.  Labs are reviewed.  Hemoglobin A1c 6.8.  BUN/creatinine and other labs are stable.  Recommended to call us  back if she has any question or any concern.  Follow-up in 6 months.  Collaboration of Care: Collaboration of Care: Other provider involved in patient's care AEB notes are available in epic to review  Patient/Guardian was advised Release of Information must be obtained prior to any record release in order to collaborate their  care with an outside provider. Patient/Guardian was advised if they have not already done so to contact the registration department to sign all necessary forms in order for us  to release information regarding their care.   Consent: Patient/Guardian gives verbal consent for treatment and assignment of benefits for services provided during this visit. Patient/Guardian expressed understanding and agreed to proceed.    Leni ONEIDA Client, MD 09/22/2023, 2:12 PM

## 2023-09-28 ENCOUNTER — Telehealth: Payer: Self-pay

## 2023-09-28 NOTE — Telephone Encounter (Signed)
 Called pt's mother and asked her to bring pt's machine to her appointment. Pt's mother states that pt's machine has been giving her a hard time, it keeps saying error. Pt's mother is going to take a picture of machine when error pop up on pt's machine

## 2023-09-28 NOTE — Patient Instructions (Incomplete)

## 2023-09-28 NOTE — Progress Notes (Unsigned)
 PATIENT: Erika [name] DOB: 27-May-1975  REASON FOR VISIT: follow up HISTORY FROM: patient  No chief complaint on file.   HISTORY OF PRESENT ILLNESS:  09/28/23 ALL:  Erika [name] returns for follow up for OSA on BiPAP. She was last seen by me 06/2023 and had not yet received new BiPAP machine. Since,    07/07/2023 ALL:  Erika [name] returns for follow up for OSA on BiPAP. We repeated HST 03/2023 showing AHI 43.4/hr and O2 nadir 85%. New BiPAP ordered. Since, she reports not hearing back to set up new BiPAP. She has continued to use her old machine. Erika [name] (pts mom) called to inquire but was told by DME that they did not receive orders from our office. She is doing well with old machine. Sleeping well. No concerns, today.     02/09/2023 ALL:  Erika [name] returns for follow up for OSA on BiPAP. She continues to do very well on therapy. She is using BiPAP nightly for about 6-7 hours. She reports sleeping well. She does have a leak but not bothered by this. AHI well managed. She continues to work at [employer].     01/18/2022 ALL: Erika [name] returns for follow up for OSA on BiPAP. She continues to do well. She is using BiPAP every night for about 7-8 hours. She sleeps well. She denies concerns with machine or supplies. She has changed her mask but continues to have a leak. She does not seem bothered by leak and AHI is well managed.     01/13/2021 ALL: Erika [name] returns for follow up for OSA on BiPAP. She continues to do very well on therapy. She is using machine every night. She reports sleeping very well. She has not noted any concerns of leak. She feels mask is comfortable and fits well. She denies concerns. She received a great report with last eye exam.     01/08/2020 ALL:  [name] is a 48 y.o. female here today for follow up for OSA on BiPAP.  She is doing very well on BiPAP therapy. She presents today with her mother who states that she is sleeping very well. They are excited that she is  doing so well. She wakes feeling refreshed. She is able to work without becoming sleepy.   Compliance report dated 12/09/2019 through 01/07/2020 reveals that she used BiPAP 29 of the past 30 days for compliance of 97%.  She used BiPAP greater than 4 hours all 29 days.  Average usage was 7 hours and 13 minutes.  Residual AHI was 4.1 with IPAP of 18 and EPAP of 14 cm water pressure.  Respiratory rate of 10.  There was a large leak noted in the 95th percentile of 76.6 L/min.  HISTORY: (copied from Dr Dohmeier's note on 11/09/2018)  11-09-2018 Erika [name] is a 48 y.o. female with BiPAP treatment and MRDD , seen here as in a referral/ revisit  from Dr. Godfrey Partner , Dr. Elmira.  Patient was last seen 1 year ago, by Elveria Lunger, NP. In the meantime she had a bite guard made, which helped the patient with bruxism prevention but also seems to have had a positive effect on her breathing in conjunction with BiPAP 1 of many  risk factors has been her weight and her current BMI is 46.35.  However she has been a very compliant user of BiPAP for the treatment of obstructive sleep apnea and has no issues with the machine. Dr. Norleen Eagles is her dentist, Miss Anira  has continued to be highly compliant she has used the machine 29 of the last 30 days including 08 November 2018.   Average user time is 6 hours and 27 minutes.  Her inspiratory pressure is 18 expiratory pressure 14 cmH2O and she uses a background SVT rate of 10 bpm so rate 10 breaths/min.  Residual AHI is 4.9 which is fine and she does have some major air leaks but the have been addressed with a new mask.   The AHI varies greatly from night to night.  The respiratory rate however states constant.  I do not need to make any adjustments in settings.    Interval history from 04 May 2017, I have the pleasure of meeting with Erika [name] and her mother today for the first time since her sleep studies were performed.  The patient underwent a  split-night polysomnography on 24 December 2016 and the study confirmed that he has a high degree of apnea with an AHI of 44.4/h of sleep.  This means that she has an apnea about every 80 seconds.  She spent all night in supine position so we could not determine if there was a positional component, and she did not reach REM sleep either.  She did not have significant low oxygen saturations none of prolonged duration however.  She was started on CPAP first was 5 cmH2O and was step-by-step titrated to 15 CPAP caused central apneas to emerge and did not reduce the overall apnea index.  The patient was therefore changed briefly to BiPAP but at that time the night was almost over.  Pressures of 15/10 centimeter and 17/12 centimeters were tried.  We asked the patient to return for a full night BiPAP titration based on these limited results.  She also developed some periodic limb movements at night but were not present in her first study.  She returned for a full night titration on 30 November, BiPAP was now initiated at a low pressure and slowly advanced.  The first pressure was 8/5 cmH2O and she finally reached 17/13 cmH2O but her AHI was reduced to 3.8 apneas per hour of sleep from previously over 44.  Snoring also was alleviated at that higher pressure.  She was placed on a full facemask which I had initially not wanted but he tolerated a small size Simplus fullface mask very well.    Ms. Navejas mother is actually more thrilled than the patient about the results.  She no longer hears her daughter snoring or have apnea, the machine is very quiet and it has improved her own sleep significantly.  [name] has been very compliant she has used the machine over 4 hours nightly for 26 out of the last 30 days, and she has used it on every of the last 30 days.  Average use of time is 5 hours and 49 minutes, the machine is set at 18/14 cmH2O BiPAP with a background respiratory rate of 10 breaths/min, her residual AHI is 0.0.   There are no more apneas.  She does have some high air leaks but these do not affect her sleep quality or the apnea control.  If troponin once I would change her to a nasal pillow or nasal mask.   Consult : She is according to her mother's observation snoring very loudly, for many years. She gained weight over the last 5 years, partially attributed to depression. The patient has a long psychiatric history, has a learning disability( Dr. Curry)  She Gained weight over  the last years due to psychotropic medication for the treatment of depression, she also has developed hypertension, and she was witnessed to have apnea after a medical procedure, but she cannot remember what kind of procedures this was. Sleep habits are as follows: Ms. Omara usually watches TV for the last hours of the day before she goes to bed. Her bedtime is around 10 PM, and she retreats to her bedroom at this time. Her bedroom is cool quiet and dark. She sleeps on her side. She avoids the right side because of an ophthalmological condition. She wears a mask for eye protection to sleep. She wakes up frequently to go to the bathroom at night at least 2 times. She rises in the morning and 5:15. Usually she has got 6 hours of sleep by that time. She feels usually rested and restored in the morning but also reports a dry mouth. She likes to chew gum to give her relief from the dry mouth. She also drinks a lot of water during the day, but has to be reminded.    Sleep medical history and family sleep history: There is no other known family history of sleep apnea, but Ms. Otterson has been observed reading irregular in her sleep. She has diabetes, HTN, super obesity. Abnormal EKG- flutter  .  She is on a sleep walker, has no history of night terrors or enuresis. She never underwent tonsillectomy and has no history of neck surgery or trauma.   Social history: Ms. Tuch lives with her parents, she is usually the first one in bed and rises  without resistance and the morning. She goes to an adult enrichment center, and enjoys it. She does not use tobacco in any form, she does not drink alcohol, caffeine use she does drink coffee, soda and ice tea. She does not drink more than 1 or 2 cups of caffeinated beverage a day. Mostly soda.   REVIEW OF SYSTEMS: Out of a complete 14 system review of symptoms, the patient complains only of the following symptoms, none and all other reviewed systems are negative.  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     ARIPiprazole  (ABILIFY ) 5 MG tablet Take 1 tablet (5 mg total) by mouth daily. 90 tablet 1   atorvastatin (LIPITOR) 20 MG tablet Take 20 mg by mouth at bedtime.     benazepril (LOTENSIN) 10 MG tablet Take 10 mg by mouth daily.     cholecalciferol (VITAMIN D) 1000 units tablet Take 1,000 Units by mouth daily.     famotidine (PEPCID) 20 MG tablet Take 20 mg by mouth 3 (three) times daily.     FARXIGA 10 MG TABS tablet Take 10 mg by mouth every morning.     ketoconazole (NIZORAL) 2 % cream Apply 1 application topically 2 (two) times daily.     loratadine (CLARITIN) 10 MG tablet Take 10 mg by mouth daily.     metFORMIN (GLUCOPHAGE)  1000 MG tablet 1,000 mg in the morning and at bedtime.     PAPAYA ENZYME PO Take 1 capsule by mouth 3 (three) times daily after meals.     sertraline  (ZOLOFT ) 100 MG tablet Take 1.5 tablets (150 mg total) by mouth daily. 135 tablet 1   temazepam  (RESTORIL ) 15  MG capsule Take 1 capsule (15 mg total) by mouth at bedtime. 30 capsule 5   No facility-administered medications prior to visit.    PAST MEDICAL HISTORY: Past Medical History:  Diagnosis Date   Anxiety    Depression    Diabetes (HCC)    GERD (gastroesophageal reflux disease)    GERD (gastroesophageal reflux disease)    Hyperlipemia    Hyperlipidemia    Learning disability    Mother helps her with medical needs   Obesity    OSA treated with BiPAP     PAST SURGICAL HISTORY: Past Surgical History:  Procedure Laterality Date   ABDOMINAL HYSTERECTOMY     CHOLECYSTECTOMY     COLONOSCOPY WITH PROPOFOL  N/A 03/19/2020   Procedure: COLONOSCOPY WITH PROPOFOL ;  Surgeon: Burnette Fallow, MD;  Location: [facility];  Service: Endoscopy;  Laterality: N/A;   ESOPHAGOGASTRODUODENOSCOPY (EGD) WITH PROPOFOL  N/A 07/07/2016   Procedure: ESOPHAGOGASTRODUODENOSCOPY (EGD) WITH PROPOFOL ;  Surgeon: Celestia Agent, MD;  Location: [facility];  Service: Endoscopy;  Laterality: N/A;    FAMILY HISTORY: Family History  Problem Relation Age of Onset   Hypertension Mother    Hyperlipidemia Mother    Diabetes Mother    Breast cancer Mother 94   Diabetes Father    Hypertension Father    Sleep apnea Neg Hx     SOCIAL HISTORY: Social History   Socioeconomic History   Marital status: Single    Spouse name: Not on file   Number of children: Not on file   Years of education: Not on file   Highest education level: Not on file  Occupational History   Not on file  Tobacco Use   Smoking status: Never   Smokeless tobacco: Never  Vaping Use   Vaping status: Never Used  Substance and Sexual Activity   Alcohol use: No    Alcohol/week: 0.0 standard drinks of alcohol   Drug use: No   Sexual activity: Not Currently    Birth control/protection: Surgical  Other Topics Concern   Not on file  Social History Narrative   Lives with her mom   Pt works    Social Drivers of Corporate investment banker  Strain: Not on Ship broker Insecurity: Not on file  Transportation Needs: Not on file  Physical Activity: Not on file  Stress: Not on file  Social Connections: Not on file  Intimate Partner Violence: Not on file     PHYSICAL EXAM  There were no vitals filed for this visit.      There is no height or weight on file to calculate BMI.  Generalized: Well developed, in no acute distress  Cardiology: normal rate and rhythm, no murmur noted Respiratory: clear to auscultation bilaterally  Neurological examination  Mentation: Alert oriented to time, place, history taking. Follows all commands speech and language fluent Cranial nerve II-XII: Pupils were equal round reactive to light. Extraocular movements were full, visual field were full  Motor: The motor testing reveals 5 over 5 strength of all 4 extremities. Good symmetric motor tone is noted throughout.  Gait and station: Gait is normal.    DIAGNOSTIC DATA (LABS, IMAGING, TESTING) - I  reviewed patient records, labs, notes, testing and imaging myself where available.      No data to display           No results found for: WBC, HGB, HCT, MCV, PLT No results found for: NA, K, CL, CO2, GLUCOSE, BUN, CREATININE, CALCIUM, PROT, ALBUMIN, AST, ALT, ALKPHOS, BILITOT, GFRNONAA, GFRAA No results found for: CHOL, HDL, LDLCALC, LDLDIRECT, TRIG, CHOLHDL No results found for: YHAJ8R No results found for: VITAMINB12 No results found for: TSH   ASSESSMENT AND PLAN 48 y.o. year old female  has a past medical history of Anxiety, Depression, Diabetes (HCC), GERD (gastroesophageal reflux disease), GERD (gastroesophageal reflux disease), Hyperlipemia, Hyperlipidemia, Learning disability, Obesity, and OSA treated with BiPAP. here with   No diagnosis found.    LANDI BISCARDI is doing well on BiPAP therapy. Compliance report shows excellent compliance. She does continue to have a  large leak, however, AHI is at goal. I have educated her and her mother of ways to monitor for and correct leak at home. She was encouraged to continue using BiPAP nightly and for greater than 4 hours each night. We will contact DME to request orders for new BiPAP placed 04/08/2023 be processed asap. She will follow up in 61-90 days following set up of new machine. She and mom verbalize understanding and agreement with this plan.    No orders of the defined types were placed in this encounter.     No orders of the defined types were placed in this encounter.     Greig Forbes, FNP-C 09/28/2023, 1:43 PM [facility] [address] [place], [place] [address] [phone]

## 2023-09-29 ENCOUNTER — Ambulatory Visit: Payer: MEDICAID | Admitting: Family Medicine

## 2023-09-29 ENCOUNTER — Encounter: Payer: Self-pay | Admitting: Family Medicine

## 2023-09-29 VITALS — BP 129/81 | HR 95 | Ht 63.0 in | Wt 251.0 lb

## 2023-09-29 DIAGNOSIS — G4733 Obstructive sleep apnea (adult) (pediatric): Secondary | ICD-10-CM

## 2023-09-29 NOTE — Progress Notes (Signed)
 Erika [name]

## 2023-11-10 ENCOUNTER — Other Ambulatory Visit: Payer: Self-pay | Admitting: Internal Medicine

## 2023-11-10 DIAGNOSIS — Z1231 Encounter for screening mammogram for malignant neoplasm of breast: Secondary | ICD-10-CM

## 2023-12-19 ENCOUNTER — Ambulatory Visit
Admission: RE | Admit: 2023-12-19 | Discharge: 2023-12-19 | Disposition: A | Payer: MEDICAID | Source: Ambulatory Visit | Attending: Internal Medicine | Admitting: Internal Medicine

## 2023-12-19 ENCOUNTER — Ambulatory Visit: Payer: MEDICAID

## 2023-12-19 DIAGNOSIS — Z1231 Encounter for screening mammogram for malignant neoplasm of breast: Secondary | ICD-10-CM

## 2024-03-05 NOTE — Progress Notes (Signed)
 SABRA

## 2024-03-06 ENCOUNTER — Encounter: Payer: Self-pay | Admitting: Family Medicine

## 2024-03-06 ENCOUNTER — Ambulatory Visit: Payer: MEDICAID | Admitting: Family Medicine

## 2024-03-06 VITALS — BP 105/72 | HR 84 | Ht 62.0 in | Wt 254.5 lb

## 2024-03-06 DIAGNOSIS — G4733 Obstructive sleep apnea (adult) (pediatric): Secondary | ICD-10-CM

## 2024-03-06 NOTE — Progress Notes (Signed)
 "   PATIENT: Erika [name] DOB: [birth year]  REASON FOR VISIT: follow up HISTORY FROM: patient  Chief Complaint  Patient presents with   RM1/ BiPAP    Pt is here with her Mother. Pt states that things have been going okay with her BiPAP machine.     HISTORY OF PRESENT ILLNESS:  03/06/2024 ALL:  Erika [name] for follow up for OSA on BiPAP. She was last seen 08/2023. We sent her for a mask refitting as she was having difficulty with seal of nasal mask. Since, she reports doing better. She continues to have a large air leak but has been most comfortable with her old nasal mask. She is sleeping better. Waking feeling refreshed.     09/29/2023 ALL:  Erika [name] for follow up for OSA on BiPAP. She was last seen by me 06/2023 and had not yet received new BiPAP machine. Since, she reports getting set up with her new machine. She has had trouble adjusting to the new mask. She is using a nasal mask and reports not having a good seal. She has always had a significant leak. Mom reports that Erika [name] is really concerned about the red frown face. She is sleeping fairly well, otherwise. She usually notes problem with leak after using the restroom in the middle of the night.     07/07/2023 ALL:  Erika [name] for follow up for OSA on BiPAP. We repeated HST 03/2023 showing AHI 43.4/hr and O2 nadir 85%. New BiPAP ordered. Since, she reports not hearing back to set up new BiPAP. She has continued to use her old machine. Erika [name] (pts mom) called to inquire but was told by DME that they did not receive orders from our office. She is doing well with old machine. Sleeping well. No concerns, today.     02/09/2023 ALL:  Erika [name] [name] for follow up for OSA on BiPAP. She continues to do very well on therapy. She is using BiPAP nightly for about 6-7 hours. She reports sleeping well. She does have a leak but not bothered by this. AHI well managed. She continues to work at [employer].     01/18/2022 ALL: Erika [name]  [name] for follow up for OSA on BiPAP. She continues to do well. She is using BiPAP every night for about 7-8 hours. She sleeps well. She denies concerns with machine or supplies. She has changed her mask but continues to have a leak. She does not seem bothered by leak and AHI is well managed.     01/13/2021 ALL: Erika [name] [name] for follow up for OSA on BiPAP. She continues to do very well on therapy. She is using machine every night. She reports sleeping very well. She has not noted any concerns of leak. She feels mask is comfortable and fits well. She denies concerns. She received a great report with last eye exam.     01/08/2020 ALL:  Erika [name] is a 49 y.o. female here today for follow up for OSA on BiPAP.  She is doing very well on BiPAP therapy. She presents today with her mother who states that she is sleeping very well. They are excited that she is doing so well. She wakes feeling refreshed. She is able to work without becoming sleepy.   Compliance report dated 12/09/2019 through 01/07/2020 reveals that she used BiPAP 29 of the past 30 days for compliance of 97%.  She used BiPAP greater than 4 hours all 29 days.  Average usage was 7 hours and  13 minutes.  Residual AHI was 4.1 with IPAP of 18 and EPAP of 14 cm water pressure.  Respiratory rate of 10.  There was a large leak noted in the 95th percentile of 76.6 L/min.  HISTORY: (copied from Erika [name] note on 11/09/2018)  11-09-2018 Erika [name] is a 49 y.o. female with BiPAP treatment and MRDD , seen here as in a referral/ revisit  from Erika. Godfrey [name] , Erika. [place].  Patient was last seen 1 year ago, by Erika Lunger, NP. In the meantime she had a bite guard made, which helped the patient with bruxism prevention but also seems to have had a positive effect on her breathing in conjunction with BiPAP 1 of many  risk factors has been her weight and her current BMI is 46.35.  However she has been a very compliant user of  BiPAP for the treatment of obstructive sleep apnea and has no issues with the machine. Erika. Erika [name] is her dentist, Miss Erika [name]  has continued to be highly compliant she has used the machine 29 of the last 30 days including 08 November 2018.   Average user time is 6 hours and 27 minutes.  Her inspiratory pressure is 18 expiratory pressure 14 cmH2O and she uses a background SVT rate of 10 bpm so rate 10 breaths/min.  Residual AHI is 4.9 which is fine and she does have some major air leaks but the have been addressed with a new mask.   The AHI varies greatly from night to night.  The respiratory rate however states constant.  I do not need to make any adjustments in settings.    Interval history from 04 May 2017, I have the pleasure of meeting with Erika [name] and her mother today for the first time since her sleep studies were performed.  The patient underwent a split-night polysomnography on 24 December 2016 and the study confirmed that he has a high degree of apnea with an AHI of 44.4/h of sleep.  This means that she has an apnea about every 80 seconds.  She spent all night in supine position so we could not determine if there was a positional component, and she did not reach REM sleep either.  She did not have significant low oxygen saturations none of prolonged duration however.  She was started on CPAP first was 5 cmH2O and was step-by-step titrated to 15 CPAP caused central apneas to emerge and did not reduce the overall apnea index.  The patient was therefore changed briefly to BiPAP but at that time the night was almost over.  Pressures of 15/10 centimeter and 17/12 centimeters were tried.  We asked the patient to return for a full night BiPAP titration based on these limited results.  She also developed some periodic limb movements at night but were not present in her first study.  She returned for a full night titration on 30 November, BiPAP was now initiated at a low pressure and slowly advanced.   The first pressure was 8/5 cmH2O and she finally reached 17/13 cmH2O but her AHI was reduced to 3.8 apneas per hour of sleep from previously over 44.  Snoring also was alleviated at that higher pressure.  She was placed on a full facemask which I had initially not wanted but he tolerated a small size Simplus fullface mask very well.    Ms. Vazques mother is actually more thrilled than the patient about the results.  She no longer hears her daughter snoring  or have apnea, the machine is very quiet and it has improved her own sleep significantly.  [name] has been very compliant she has used the machine over 4 hours nightly for 26 out of the last 30 days, and she has used it on every of the last 30 days.  Average use of time is 5 hours and 49 minutes, the machine is set at 18/14 cmH2O BiPAP with a background respiratory rate of 10 breaths/min, her residual AHI is 0.0.  There are no more apneas.  She does have some high air leaks but these do not affect her sleep quality or the apnea control.  If troponin once I would change her to a nasal pillow or nasal mask.   Consult : She is according to her mother's observation snoring very loudly, for many years. She gained weight over the last 5 years, partially attributed to depression. The patient has a long psychiatric history, has a learning disability( Erika. Curry)  She Gained weight over the last years due to psychotropic medication for the treatment of depression, she also has developed hypertension, and she was witnessed to have apnea after a medical procedure, but she cannot remember what kind of procedures this was. Sleep habits are as follows: Ms. Volland usually watches TV for the last hours of the day before she goes to bed. Her bedtime is around 10 PM, and she retreats to her bedroom at this time. Her bedroom is cool quiet and dark. She sleeps on her side. She avoids the right side because of an ophthalmological condition. She wears a mask for eye  protection to sleep. She wakes up frequently to go to the bathroom at night at least 2 times. She rises in the morning and 5:15. Usually she has got 6 hours of sleep by that time. She feels usually rested and restored in the morning but also reports a dry mouth. She likes to chew gum to give her relief from the dry mouth. She also drinks a lot of water during the day, but has to be reminded.    Sleep medical history and family sleep history: There is no other known family history of sleep apnea, but Ms. Whisman has been observed reading irregular in her sleep. She has diabetes, HTN, super obesity. Abnormal EKG- flutter  .  She is on a sleep walker, has no history of night terrors or enuresis. She never underwent tonsillectomy and has no history of neck surgery or trauma.   Social history: Ms. Berman lives with her parents, she is usually the first one in bed and rises without resistance and the morning. She goes to an adult enrichment center, and enjoys it. She does not use tobacco in any form, she does not drink alcohol, caffeine use she does drink coffee, soda and ice tea. She does not drink more than 1 or 2 cups of caffeinated beverage a day. Mostly soda.   REVIEW OF SYSTEMS: Out of a complete 14 system review of symptoms, the patient complains only of the following symptoms, none and all other reviewed systems are negative.  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     ARIPiprazole   (ABILIFY ) 5 MG tablet Take 1 tablet (5 mg total) by mouth daily. 90 tablet 1   atorvastatin (LIPITOR) 20 MG tablet Take 20 mg by mouth at bedtime.     benazepril (LOTENSIN) 10 MG tablet Take 10 mg by mouth daily.     cholecalciferol (VITAMIN D) 1000 units tablet Take 1,000 Units by mouth daily.     famotidine (PEPCID) 20 MG tablet Take 20 mg by mouth 3 (three) times daily.     FARXIGA 10 MG TABS tablet Take 10 mg by mouth every morning.     ketoconazole (NIZORAL) 2 % cream Apply 1 application topically 2 (two) times daily.     loratadine (CLARITIN) 10 MG tablet Take 10 mg by mouth daily.     metFORMIN (GLUCOPHAGE) 1000 MG tablet 1,000 mg in the morning and at bedtime.     PAPAYA ENZYME PO Take 1 capsule by mouth 3 (three) times daily after meals.     sertraline  (ZOLOFT ) 100 MG tablet Take 1.5 tablets (150 mg total) by mouth daily. 135 tablet 1   temazepam  (RESTORIL ) 15 MG capsule Take 1 capsule (15 mg total) by mouth at bedtime. 30 capsule 5   No facility-administered medications prior to visit.    PAST MEDICAL HISTORY: Past Medical History:  Diagnosis Date   Anxiety    Depression    Diabetes (HCC)    GERD (gastroesophageal reflux disease)    GERD (gastroesophageal reflux disease)    Hyperlipemia    Hyperlipidemia    Learning disability    Mother helps her with medical needs   Obesity    OSA treated with BiPAP     PAST SURGICAL HISTORY: Past Surgical History:  Procedure Laterality Date   ABDOMINAL HYSTERECTOMY     CHOLECYSTECTOMY     COLONOSCOPY WITH PROPOFOL  N/A 03/19/2020   Procedure: COLONOSCOPY WITH PROPOFOL ;  Surgeon: Burnette Fallow, MD;  Location: [facility];  Service: Endoscopy;  Laterality: N/A;   ESOPHAGOGASTRODUODENOSCOPY (EGD) WITH PROPOFOL  N/A 07/07/2016   Procedure: ESOPHAGOGASTRODUODENOSCOPY (EGD) WITH PROPOFOL ;  Surgeon: Celestia Agent, MD;  Location: [facility];  Service: Endoscopy;  Laterality: N/A;    FAMILY HISTORY: Family History  Problem Relation  Age of Onset   Hypertension Mother    Hyperlipidemia Mother    Diabetes Mother    Breast cancer Mother 56   Diabetes Father    Hypertension Father    Sleep apnea Neg Hx     SOCIAL HISTORY: Social History   Socioeconomic History   Marital status: Single    Spouse name: Not on file   Number of children: Not on file   Years of education: Not on file   Highest education level: Not on file  Occupational History   Not on file  Tobacco Use   Smoking status: Never   Smokeless tobacco: Never  Vaping Use   Vaping status: Never Used  Substance and Sexual Activity   Alcohol use: No    Alcohol/week: 0.0 standard drinks of alcohol   Drug use: No   Sexual activity: Not Currently    Birth control/protection: Surgical  Other Topics Concern   Not on file  Social History Narrative   Lives with her mom   Pt works    Social Drivers of Health  Tobacco Use: Low Risk (03/06/2024)   Patient History    Smoking Tobacco Use: Never    Smokeless Tobacco Use: Never    Passive Exposure: Not on file  Financial Resource Strain: Not on file  Food Insecurity: Not on file  Transportation Needs: Not on file  Physical Activity: Not on file  Stress: Not on file  Social Connections: Not on file  Intimate [name] Violence: Not on file  Depression (EYV7-0): Not on file  Alcohol Screen: Not on file  Housing: Not on file  Utilities: Not on file  Health Literacy: Not on file     PHYSICAL EXAM  Vitals:   03/06/24 1350  BP: 105/72  Pulse: 84  SpO2: 98%  Weight: 254 lb 8 oz (115.4 kg)  Height: 5' 2 (1.575 m)         Body mass index is 46.55 kg/m.  Generalized: Well developed, in no acute distress  Cardiology: normal rate and rhythm, no murmur noted Respiratory: clear to auscultation bilaterally  Neurological examination  Mentation: Alert oriented to time, place, history taking. Follows all commands speech and language fluent Cranial nerve II-XII: Pupils were equal round reactive to  light. Extraocular movements were full, visual field were full  Motor: The motor testing reveals 5 over 5 strength of all 4 extremities. Good symmetric motor tone is noted throughout.  Gait and station: Gait is normal.    DIAGNOSTIC DATA (LABS, IMAGING, TESTING) - I reviewed patient records, labs, notes, testing and imaging myself where available.      No data to display           No results found for: WBC, HGB, HCT, MCV, PLT No results found for: NA, K, CL, CO2, GLUCOSE, BUN, CREATININE, CALCIUM, PROT, ALBUMIN, AST, ALT, ALKPHOS, BILITOT, GFRNONAA, GFRAA No results found for: CHOL, HDL, LDLCALC, LDLDIRECT, TRIG, CHOLHDL No results found for: YHAJ8R No results found for: VITAMINB12 No results found for: TSH   ASSESSMENT AND PLAN 49 y.o. year old female  has a past medical history of Anxiety, Depression, Diabetes (HCC), GERD (gastroesophageal reflux disease), GERD (gastroesophageal reflux disease), Hyperlipemia, Hyperlipidemia, Learning disability, Obesity, and OSA treated with BiPAP. here with     ICD-10-CM   1. OSA treated with BiPAP  G47.33       FRANKYE SCHWEGEL is doing well on BiPAP therapy. Compliance report shows optimal compliance. She does continue to have a large leak, however, AHI is at goal. I have educated her and her mother of ways to monitor for and correct leak at home. She was encouraged to continue using BiPAP nightly and for greater than 4 hours each night. She will follow up in 6 months. She and mom verbalize understanding and agreement with this plan.    No orders of the defined types were placed in this encounter.     No orders of the defined types were placed in this encounter.     Greig Forbes, FNP-C 03/06/2024, 2:24 PM [facility] [address], [address] [place], [place] [address] [phone]  "

## 2024-03-06 NOTE — Patient Instructions (Signed)

## 2024-03-08 ENCOUNTER — Other Ambulatory Visit (HOSPITAL_COMMUNITY): Payer: Self-pay | Admitting: *Deleted

## 2024-03-08 DIAGNOSIS — F200 Paranoid schizophrenia: Secondary | ICD-10-CM

## 2024-03-08 DIAGNOSIS — F5101 Primary insomnia: Secondary | ICD-10-CM

## 2024-03-08 DIAGNOSIS — F419 Anxiety disorder, unspecified: Secondary | ICD-10-CM

## 2024-03-08 MED ORDER — TEMAZEPAM 15 MG PO CAPS: 15.0000 mg | ORAL_CAPSULE | Freq: Every day | ORAL | 0 refills | Status: DC

## 2024-03-11 ENCOUNTER — Other Ambulatory Visit (HOSPITAL_COMMUNITY): Payer: Self-pay | Admitting: Psychiatry

## 2024-03-11 DIAGNOSIS — F200 Paranoid schizophrenia: Secondary | ICD-10-CM

## 2024-03-13 ENCOUNTER — Other Ambulatory Visit (HOSPITAL_COMMUNITY): Payer: Self-pay | Admitting: *Deleted

## 2024-03-13 DIAGNOSIS — F200 Paranoid schizophrenia: Secondary | ICD-10-CM

## 2024-03-13 MED ORDER — ARIPIPRAZOLE 5 MG PO TABS: 5.0000 mg | ORAL_TABLET | Freq: Every day | ORAL | 0 refills | Status: DC

## 2024-03-29 ENCOUNTER — Ambulatory Visit (HOSPITAL_COMMUNITY): Payer: MEDICAID | Admitting: Psychiatry

## 2024-03-29 ENCOUNTER — Encounter (HOSPITAL_COMMUNITY): Payer: Self-pay | Admitting: Psychiatry

## 2024-03-29 VITALS — BP 110/72 | Wt 254.0 lb

## 2024-03-29 DIAGNOSIS — F200 Paranoid schizophrenia: Secondary | ICD-10-CM

## 2024-03-29 DIAGNOSIS — F419 Anxiety disorder, unspecified: Secondary | ICD-10-CM

## 2024-03-29 DIAGNOSIS — F5101 Primary insomnia: Secondary | ICD-10-CM

## 2024-03-29 MED ORDER — SERTRALINE HCL 100 MG PO TABS: 150.0000 mg | ORAL_TABLET | Freq: Every day | ORAL | 1 refills | Status: AC

## 2024-03-29 MED ORDER — ARIPIPRAZOLE 5 MG PO TABS: 5.0000 mg | ORAL_TABLET | Freq: Every day | ORAL | 1 refills | Status: AC

## 2024-03-29 MED ORDER — TEMAZEPAM 15 MG PO CAPS: 15.0000 mg | ORAL_CAPSULE | Freq: Every day | ORAL | 5 refills | Status: AC

## 2024-03-29 NOTE — Progress Notes (Signed)
 [facility] MD/PA/NP OP Progress Note  03/29/2024 1:06 PM Erika [name]  MRN:  [id number]  Chief Complaint:  Chief Complaint  Patient presents with   Follow-up   Medication Refill   HPI: Patient came to the office with her mother on her appointment.  Mother reported recently patient more isolated and withdrawn because dog died who they have for 13 years.  Patient was very close to her dog.  Patient had gained few pounds in last blood work shows hemoglobin A1c slightly increased from past.  Her lab also shows anemia and low iron level.  Patient is still work 5 days a week at [employer] and enjoy her work there.  Mother is very involved in the treatment plan.  Mother reported coworkers are very happy with the patient.  Patient denies any agitation, anger, mania, psychosis patient denies any hallucination or any paranoia.  She reported feels sad about losing the dog but denies any suicidal thoughts or crying spells.  Her sleep is fair.  She is compliant with medication and reported no tremor or shakes or any EPS.  She denies any panic attack.  Her PCP is Dr. Mindy and she is taking Synjardy.  Patient checks blood sugar more frequently and hoping to get better.  She reported holidays were good and she enjoy visiting family members.  Patient denies any drinking or using any illegal substance.  Visit Diagnosis:    ICD-10-CM   1. Paranoid schizophrenia (HCC)  F20.0 ARIPiprazole  (ABILIFY ) 5 MG tablet    sertraline  (ZOLOFT ) 100 MG tablet    temazepam  (RESTORIL ) 15 MG capsule    2. Anxiety  F41.9 sertraline  (ZOLOFT ) 100 MG tablet    temazepam  (RESTORIL ) 15 MG capsule    3. Primary insomnia  F51.01 temazepam  (RESTORIL ) 15 MG capsule       Past Psychiatric History: Reviewed H/O psychosis since teens. H/O at least 3 inpatient at [facility] due to paranoia and noncompliant with medication. H/O taking multiple meds which gradually reduced and discontinued. No h/o suicidal attempt.         Past Medical History:   Diagnosis Date   Anxiety    Depression    Diabetes (HCC)    GERD (gastroesophageal reflux disease)    GERD (gastroesophageal reflux disease)    Hyperlipemia    Hyperlipidemia    Learning disability    Mother helps her with medical needs   Obesity    OSA treated with BiPAP     Past Surgical History:  Procedure Laterality Date   ABDOMINAL HYSTERECTOMY     CHOLECYSTECTOMY     COLONOSCOPY WITH PROPOFOL  N/A 03/19/2020   Procedure: COLONOSCOPY WITH PROPOFOL ;  Surgeon: Burnette Fallow, MD;  Location: [facility] ENDOSCOPY;  Service: Endoscopy;  Laterality: N/A;   ESOPHAGOGASTRODUODENOSCOPY (EGD) WITH PROPOFOL  N/A 07/07/2016   Procedure: ESOPHAGOGASTRODUODENOSCOPY (EGD) WITH PROPOFOL ;  Surgeon: Celestia Agent, MD;  Location: [facility] ENDOSCOPY;  Service: Endoscopy;  Laterality: N/A;    Family Psychiatric History: Reviewed  Family History:  Family History  Problem Relation Age of Onset   Hypertension Mother    Hyperlipidemia Mother    Diabetes Mother    Breast cancer Mother 83   Diabetes Father    Hypertension Father    Sleep apnea Neg Hx     Social History:  Social History   Socioeconomic History   Marital status: Single    Spouse name: Not on file   Number of children: Not on file   Years of education: Not on file  Highest education level: Not on file  Occupational History   Not on file  Tobacco Use   Smoking status: Never   Smokeless tobacco: Never  Vaping Use   Vaping status: Never Used  Substance and Sexual Activity   Alcohol use: No    Alcohol/week: 0.0 standard drinks of alcohol   Drug use: No   Sexual activity: Not Currently    Birth control/protection: Surgical  Other Topics Concern   Not on file  Social History Narrative   Lives with her mom   Pt works    Social Drivers of Health   Tobacco Use: Low Risk (03/06/2024)   Patient History    Smoking Tobacco Use: Never    Smokeless Tobacco Use: Never    Passive Exposure: Not on file  Financial Resource Strain: Not on  file  Food Insecurity: Not on file  Transportation Needs: Not on file  Physical Activity: Not on file  Stress: Not on file  Social Connections: Not on file  Depression (EYV7-0): Not on file  Alcohol Screen: Not on file  Housing: Not on file  Utilities: Not on file  Health Literacy: Not on file    Allergies:  Allergies  Allergen Reactions   Haldol [Haloperidol Decanoate] Other (See Comments)    NMS (neuroleptic malignant syndrome) , 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      Metabolic Disorder Labs: No results found for: HGBA1C, MPG No results found for: PROLACTIN No results found for: CHOL, TRIG, HDL, CHOLHDL, VLDL, LDLCALC No results found for: TSH  Therapeutic Level Labs: No results found for: LITHIUM No results found for: VALPROATE No results found for: CBMZ  Current Medications: Current Outpatient Medications  Medication Sig Dispense Refill   Accu-Chek FastClix Lancets MISC USE TO CHECK GLUCOSE TWICE DAILY     ACCU-CHEK GUIDE test strip USE 1 TEST STRIP TO TEST BLOOD GLUCOSE ONCE DAILY AS DIRECTED     acetaminophen (TYLENOL) 500 MG tablet Take 1,000 mg by mouth every 6 (six) hours as needed for moderate pain or headache.     ARIPiprazole  (ABILIFY ) 5 MG tablet Take 1 tablet (5 mg total) by mouth daily. 90 tablet 1   atorvastatin (LIPITOR) 20 MG tablet Take 20 mg by mouth at bedtime.     benazepril (LOTENSIN) 10 MG tablet Take 10 mg by mouth daily.     cholecalciferol (VITAMIN D) 1000 units tablet Take 1,000 Units by mouth daily.     Empagliflozin-metFORMIN HCl (SYNJARDY) 12.5-500 MG TABS 1 tablet 10 mg Orally once a day a day     famotidine (PEPCID) 20 MG tablet Take 20 mg by mouth 3 (three) times daily.     ketoconazole (NIZORAL) 2 % cream Apply 1 application topically 2 (two) times daily.     loratadine (CLARITIN) 10 MG tablet Take 10 mg by mouth daily.     PAPAYA ENZYME PO Take 1 capsule by mouth 3 (three) times daily after meals.     sertraline  (ZOLOFT ) 100 MG  tablet Take 1.5 tablets (150 mg total) by mouth daily. 135 tablet 1   temazepam  (RESTORIL ) 15 MG capsule Take 1 capsule (15 mg total) by mouth at bedtime. 30 capsule 5   No current facility-administered medications for this visit.     Musculoskeletal: Strength & Muscle Tone: within normal limits Gait & Station: normal Patient leans: N/A  Psychiatric Specialty Exam: Review of Systems  Blood pressure 110/72, weight 254 lb (115.2 kg).There is no height or weight on file to calculate BMI.  General Appearance: Casual  Eye Contact:  Fair  Speech:  Slow  Volume:  Decreased  Mood:  Euthymic  Affect:  Constricted  Thought Process:  Descriptions of Associations: Intact  Orientation:  Full (Time, Place, and Person)  Thought Content: Logical   Suicidal Thoughts:  No  Homicidal Thoughts:  No  Memory:  Immediate;   Fair Recent;   Fair Remote;   Fair  Judgement:  Intact  Insight:  Present  Psychomotor Activity:  Decreased  Concentration:  Concentration: Fair and Attention Span: Fair  Recall:  [employer] of Knowledge: Fair  Language: Fair  Akathisia:  No  Handed:  Right  AIMS (if indicated): not done  Assets:  Communication Skills Desire for Improvement Housing Social Support Transportation  ADL's:  Intact  Cognition: WNL  Sleep:  Good   Screenings: Flowsheet Row Video Visit from 09/02/2020 in [facility] Video Visit from 06/02/2020 in [facility] Admission (Discharged) from 03/19/2020 in [facility]  C-SSRS RISK CATEGORY No Risk No Risk No Risk     Assessment and Plan: Patient is 49 years old African-American female with paranoid schizophrenia, anxiety and primary insomnia.  She also have hyperactive lipidemia, hypertension, vitamin D deficiency and diabetes.  Review recent blood work results from PCP.  Hemoglobin A1c 7.3, WBC 11.9, hemoglobin 11.1, low iron level and low  hematocrit.  Patient is now focusing on her general health.  She started taking iron pills.  She is also watching her calorie intake and checking her blood sugar regularly.  We discussed further lowering the Abilify  but patient and her mother does not want to lower the medication because both feel medicine working and symptoms are stable.  Continue Zoloft  150 mg daily, Abilify  5 mg daily and temazepam  50 mg at bedtime.  Recommend to call back if she is any question or any concern.  Follow-up in 6 months.  Collaboration of Care: Collaboration of Care: Other provider involved in patient's care AEB notes are available in epic to review  Patient/Guardian was advised Release of Information must be obtained prior to any record release in order to collaborate their care with an outside provider. Patient/Guardian was advised if they have not already done so to contact the registration department to sign all necessary forms in order for us  to release information regarding their care.   Consent: Patient/Guardian gives verbal consent for treatment and assignment of benefits for services provided during this visit. Patient/Guardian expressed understanding and agreed to proceed.   I personally spent a total of 32 minutes in the care of the patient today including preparing to see the patient, getting/reviewing separately obtained history, performing a medically appropriate exam/evaluation, counseling and educating, placing orders, documenting clinical information in the EHR, independently interpreting results, communicating results, and coordinating care.   Leni ONEIDA Client, MD 03/29/2024, 1:06 PM

## 2024-04-26 ENCOUNTER — Ambulatory Visit: Payer: MEDICAID | Admitting: Family Medicine

## 2024-09-27 ENCOUNTER — Ambulatory Visit (HOSPITAL_COMMUNITY): Payer: MEDICAID | Admitting: Psychiatry

## 2025-03-18 ENCOUNTER — Ambulatory Visit: Payer: MEDICAID | Admitting: Family Medicine
